# Patient Record
Sex: Female | Born: 1955 | Race: White | Hispanic: Yes | Marital: Single
Health system: Midwestern US, Community
[De-identification: ages and names within clinical notes are randomized; demographics above are authoritative.]

## PROBLEM LIST (undated history)

## (undated) DIAGNOSIS — R600 Localized edema: Secondary | ICD-10-CM

## (undated) DIAGNOSIS — J4530 Mild persistent asthma, uncomplicated: Principal | ICD-10-CM

## (undated) DIAGNOSIS — K219 Gastro-esophageal reflux disease without esophagitis: Secondary | ICD-10-CM

## (undated) DIAGNOSIS — F319 Bipolar disorder, unspecified: Secondary | ICD-10-CM

## (undated) DIAGNOSIS — F900 Attention-deficit hyperactivity disorder, predominantly inattentive type: Secondary | ICD-10-CM

## (undated) DIAGNOSIS — F5101 Primary insomnia: Principal | ICD-10-CM

---

## 2017-02-22 IMAGING — CT CT SINUS WITHOUT CONTRAST
3 series · 13 of 47 positions shown, 15 images · non-contrast
Comparison: There are no previous exams available for comparison.

CT SINUS WITHOUT CONTRAST, 02/22/2017 [DATE]: 
CLINICAL INDICATION:  Allergic rhinitis. Sinus infection. Headaches. 
A search for DICOM formatted images was conducted for prior CT imaging studies 
completed at a non-affiliated media free facility.
TECHNIQUE: The paranasal sinuses were scanned without contrast on a high 
resolution CT scanner using dose reduction techniques.  Routine MPR 
reconstructions were performed.

[Series 4: sinus st 0.75 h30s · axial · 0.37mm/px · z∈[-164,-84]mm · 7 of 138 slices shown, 9 images]
[im 15/138  brain]
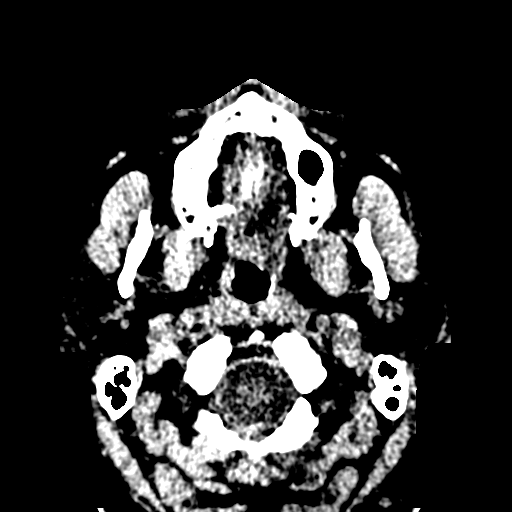
[im 15/138  bone]
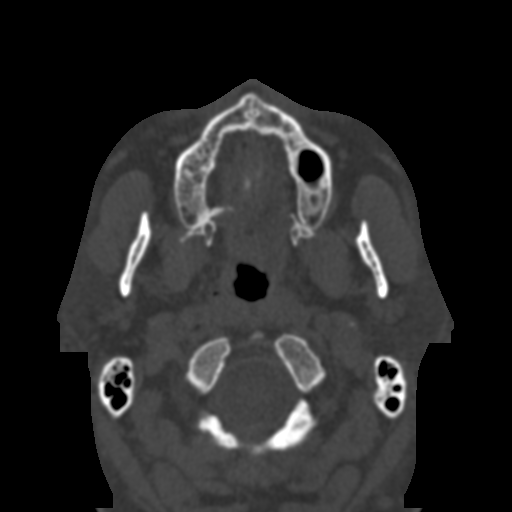
[im 34/138  bone]
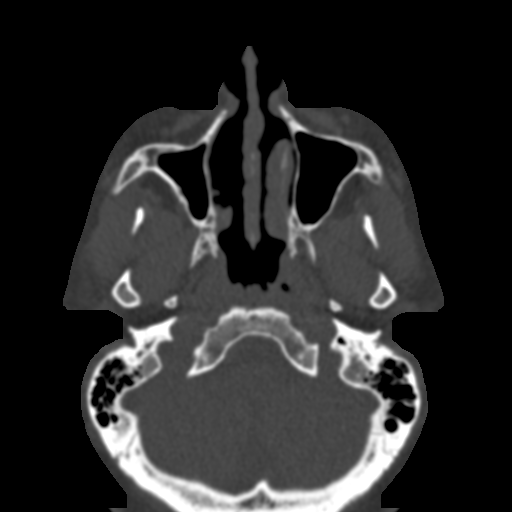
[im 52/138  bone]
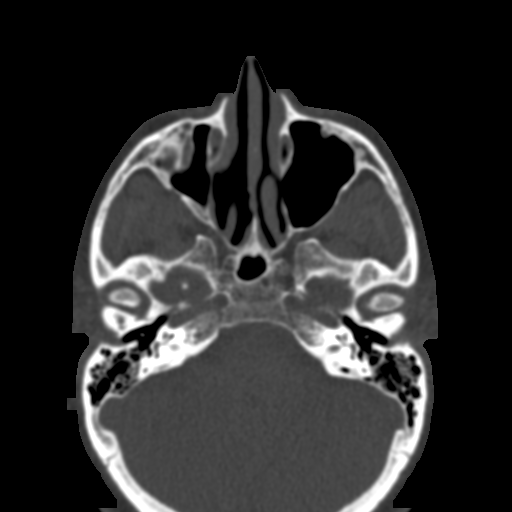
[im 71/138  bone]
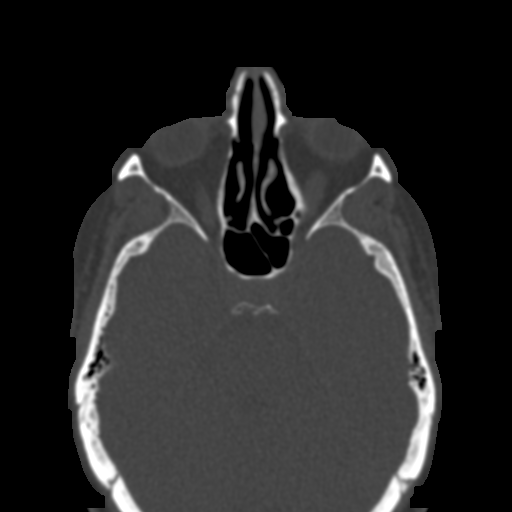
[im 90/138  brain]
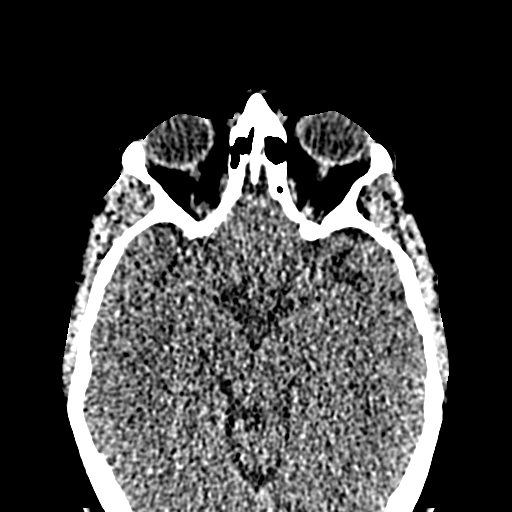
[im 90/138  bone]
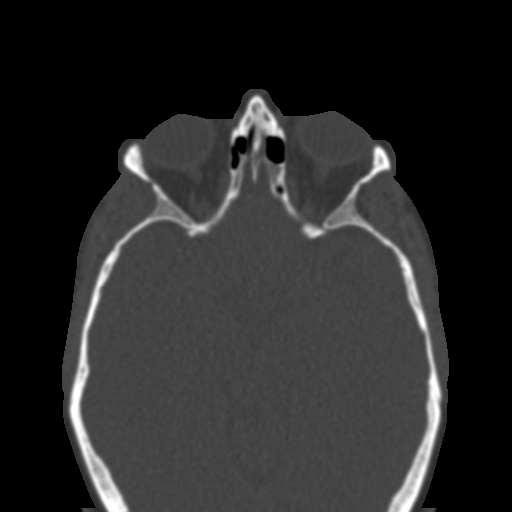
[im 109/138  bone]
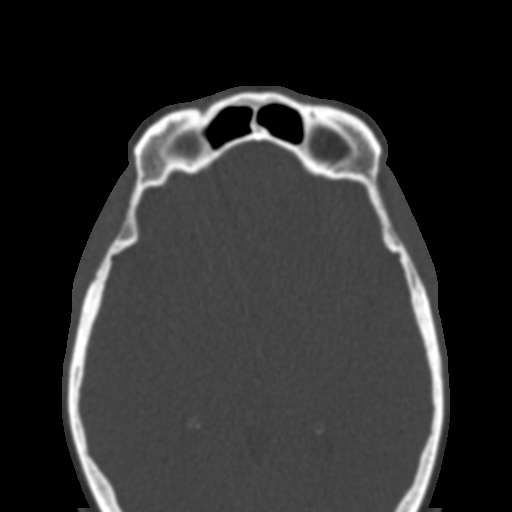
[im 128/138  bone]
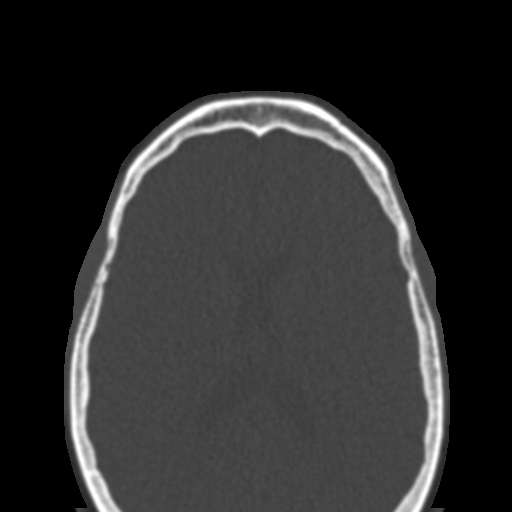

[Series 5: coronal · coronal · 0.20mm/px · 3 of 176 slices shown]
[im 59/176  bone]
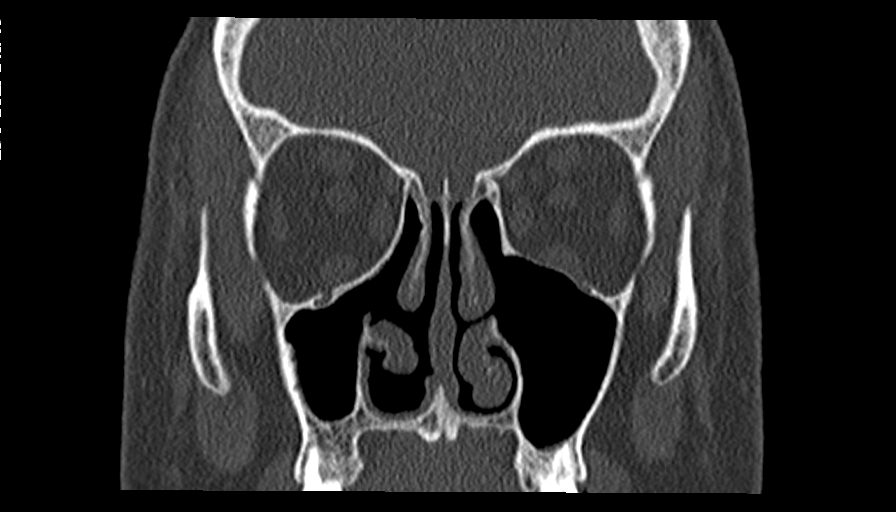
[im 78/176  bone]
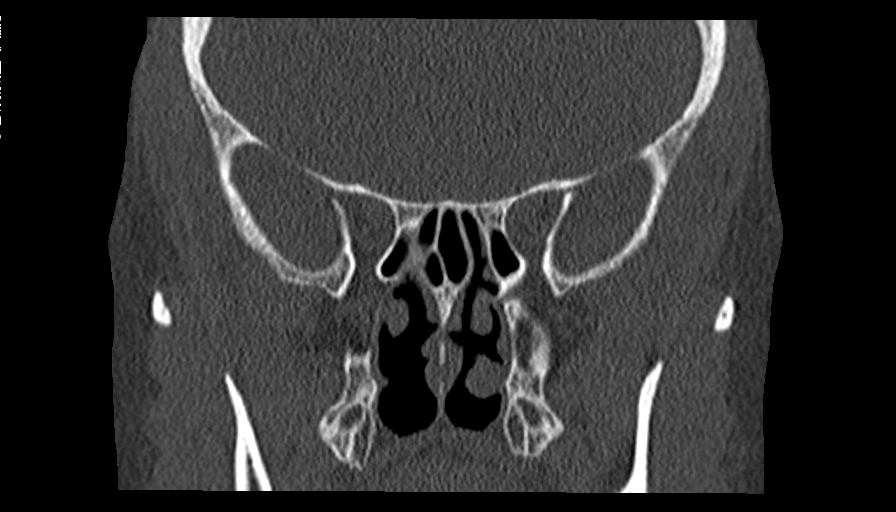
[im 98/176  bone]
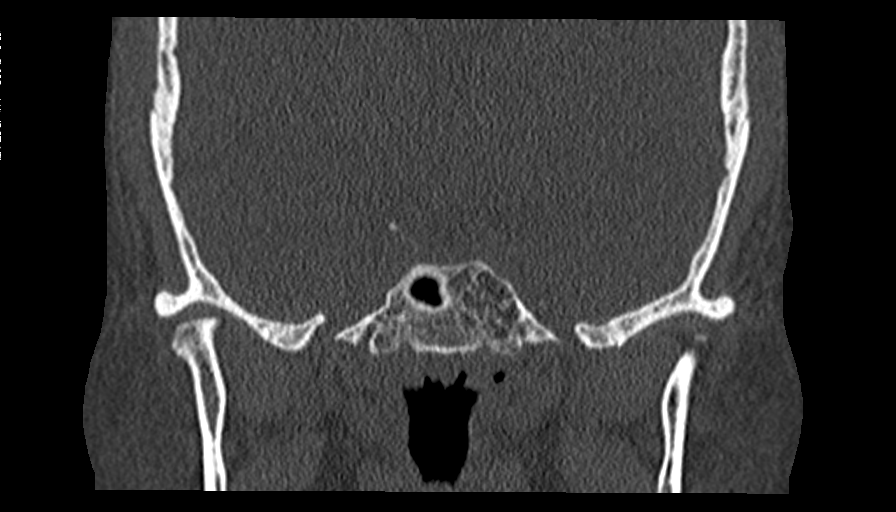

[Series 6: sag · sagittal · 0.21mm/px · 3 of 156 slices shown]
[im 52/156  bone]
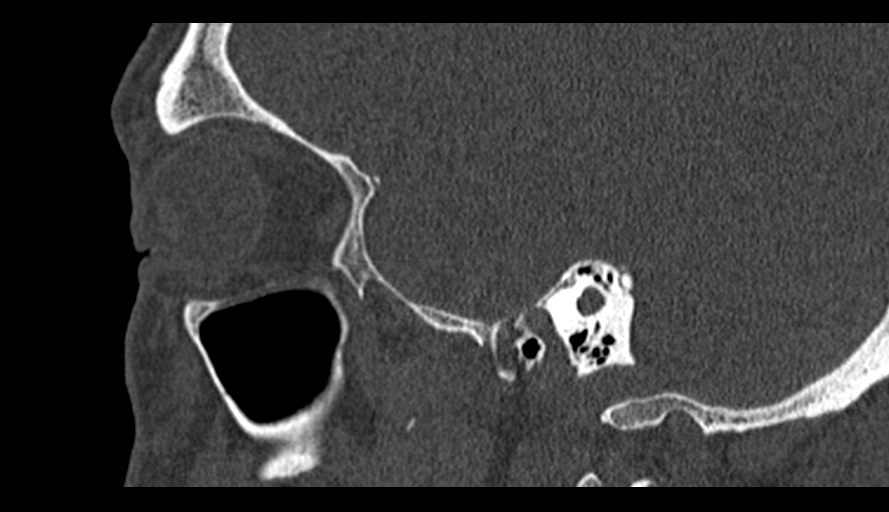
[im 78/156  bone]
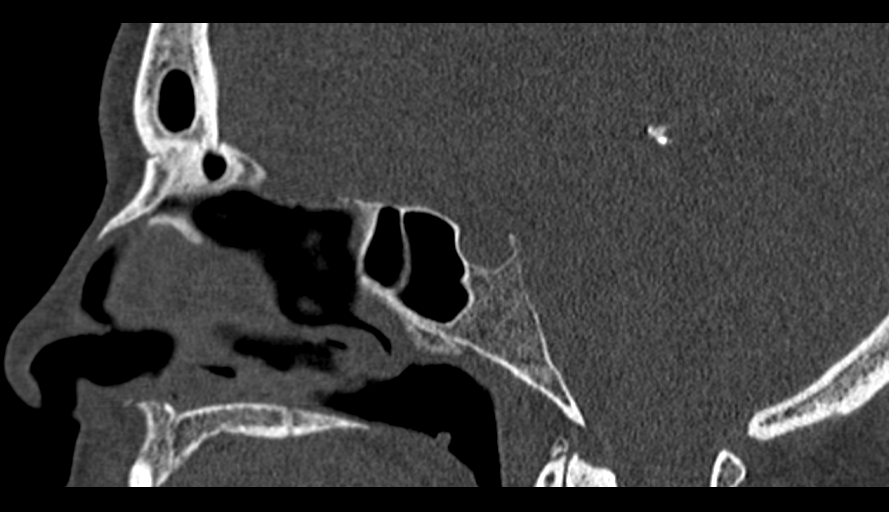
[im 104/156  bone]
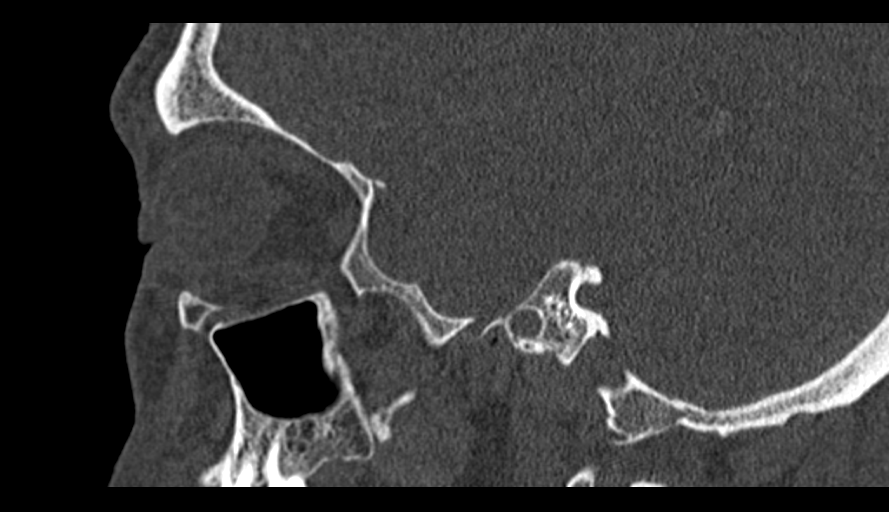

[13 of 47 positions shown; findings below may reference images not displayed]

FINDINGS: Sinuses: Prior sinus surgery bilaterally. Wide openings are present between 
the 
nasal cavity as well as superior medial aspect of the maxillary sinuses. 
Ostiomeatal complexes are absent. Widely patent. Prior partial surgical 
removal 
of portions of the anterior as well as posterior ethmoid sinuses. Partial 
turbinate removal. Operative sites appear unremarkable. Minimal mucosal 
thickening inferiorly within the left maxillary sinus. Sinuses are otherwise 
clear. Right maxillary sinus somewhat smaller as compared with the left. 
Sphenoid ethmoid recesses: Clear 
Frontal recess: Clear. 
The nasal septum is midline in position. Remaining turbinates equal and 
symmetric. Orbital walls are intact. No evidence of an air-fluid level. 
TMJ: Normal 
Mastoid air cells: Clear
IMPRESSION: 1. Evidence of prior extensive sinus surgery bilaterally. Partial surgical 
removal of the superior medial walls of the maxillary sinuses bilaterally, 
anterior as well as posterior ethmoid sinuses, as well as turbinates. 
Operative 
sites appear unremarkable and widely patent. 
2. Minimal mucosal thickening inferiorly left maxillary sinus. Sinuses 
otherwise 
clear. 
3. Frontal recesses as well as sphenoethmoid recesses are clear. 
RADIATION DOSE REDUCTION: All CT scans are performed using radiation dose 
reduction techniques, when applicable.  Technical factors are evaluated and 
adjusted to ensure appropriate moderation of exposure.  Automated dose 
management technology is applied to adjust the radiation doses to minimize 
exposure while achieving diagnostic quality images.

## 2017-02-22 IMAGING — DX ABDOMEN 1 VIEW
1 series · 2 of 2 positions shown · non-contrast
Comparison: None

ABDOMEN 1 VIEW, 02/22/2017 [DATE]: 
CLINICAL INDICATION: 61-year-old female with abdominal bloating for several 
years, pain, history of C-section and appendectomy, smoker

[Series 1: AP · U · 0.14mm/px · 2 of 2 slices shown]
[im 1/2]
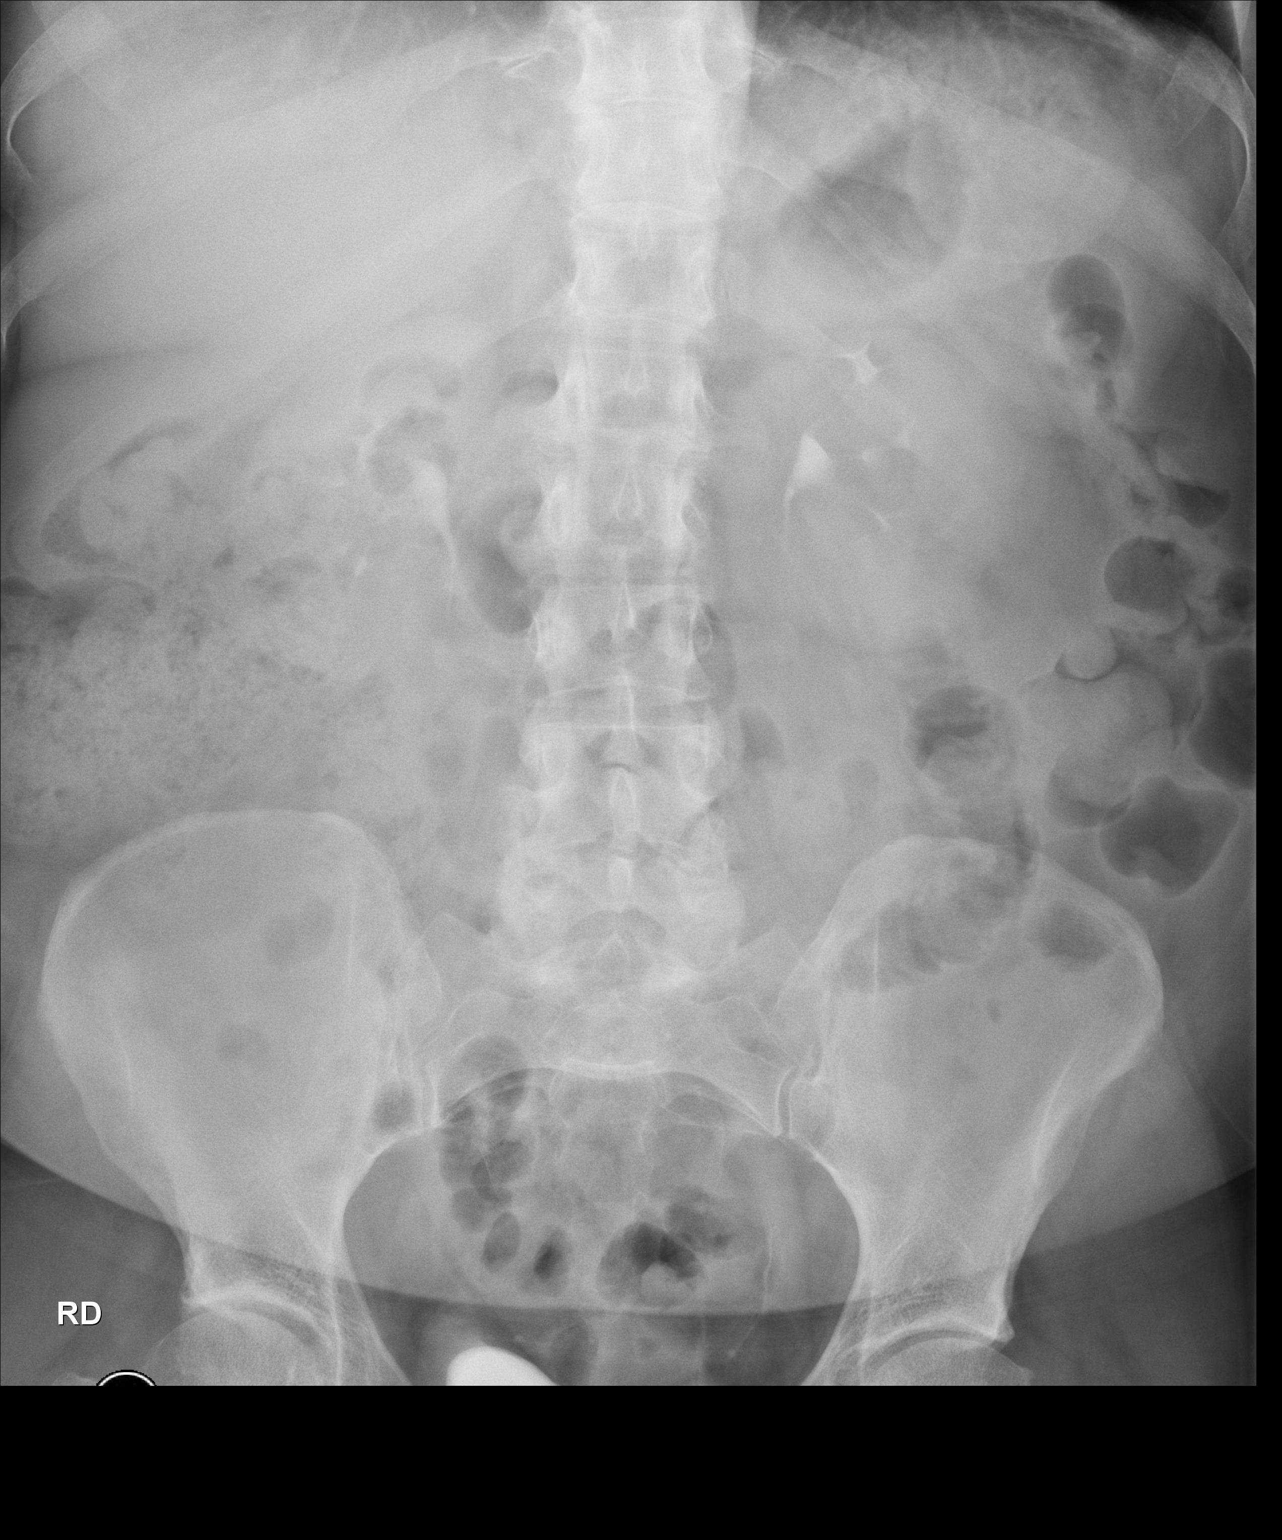
[im 2/2]
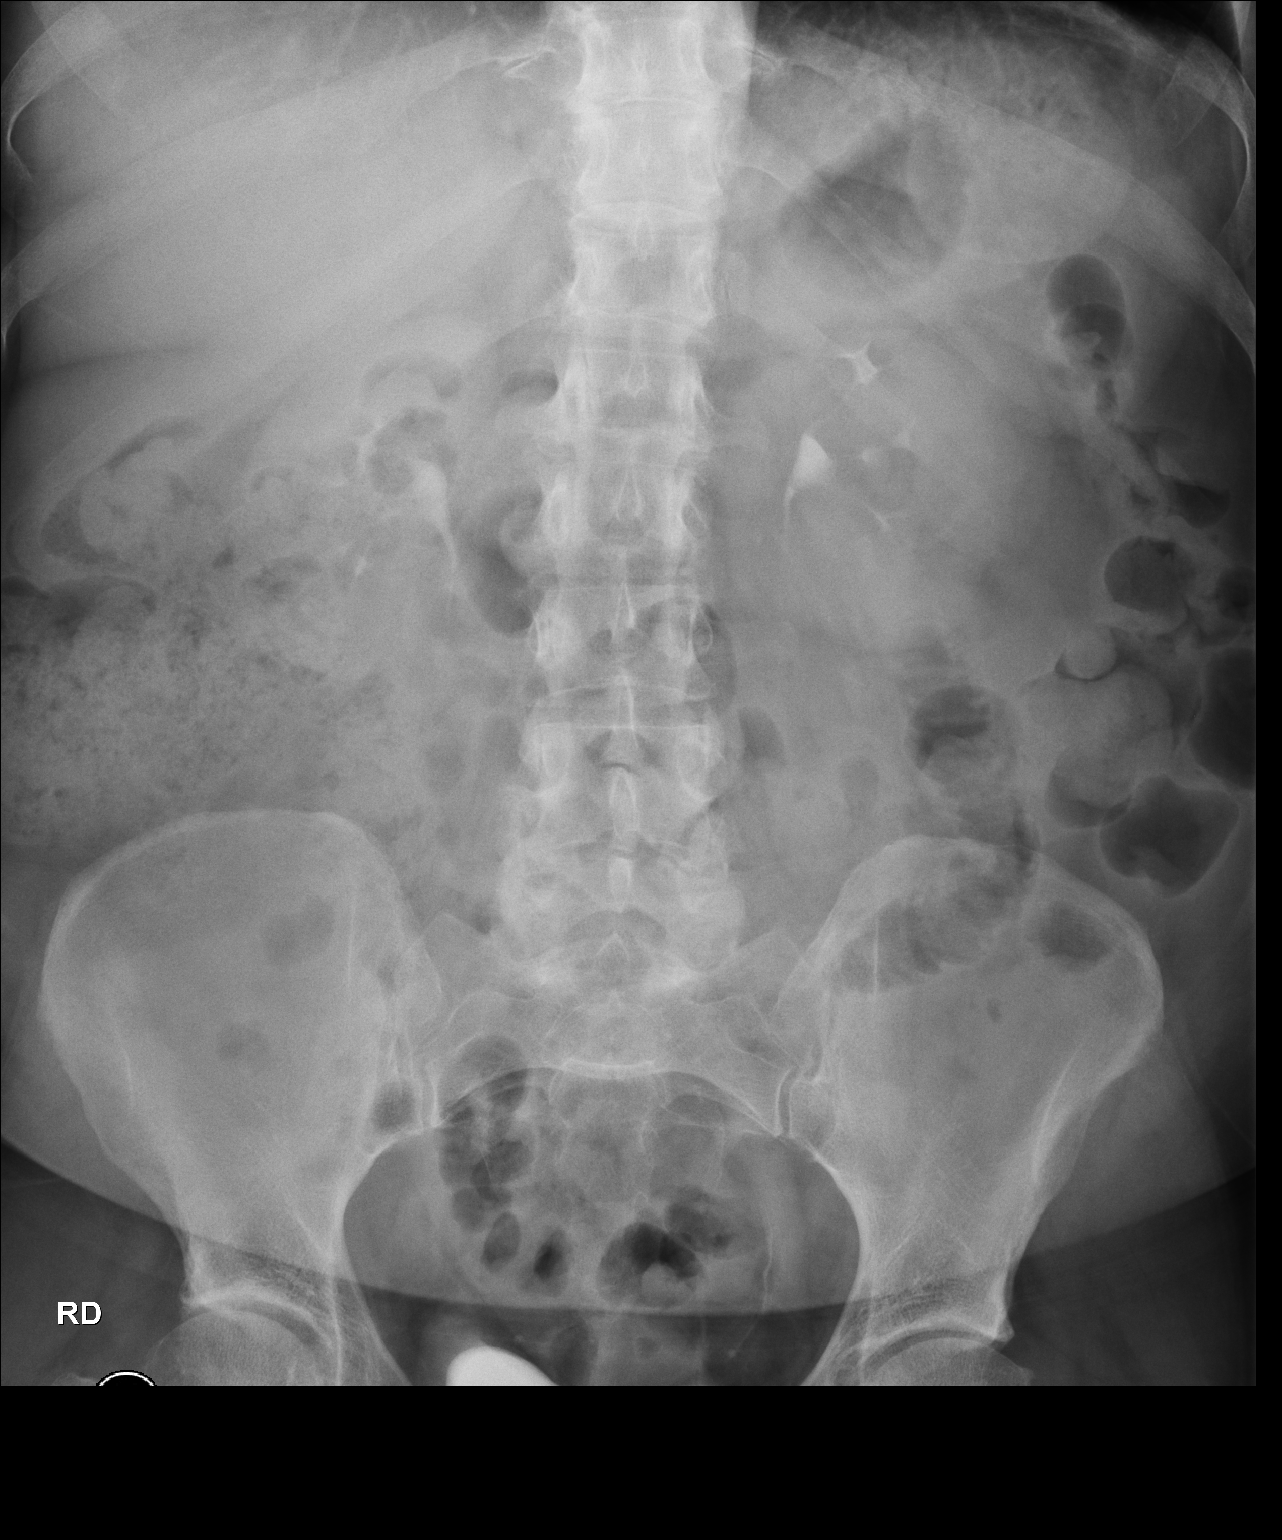

[2 of 2 positions shown; findings below may reference images not displayed]

FINDINGS: KUB was performed following CT neck performed with IV contrast. There is 
excretion of contrast from both kidneys with nondilated collecting systems and 
filling of the urinary bladder. 
Moderate fecal retention in the right and left abdomen. 
No definite suspicious calcification. Cannot assess for free air on this 
single 
view exam. 
Bilateral hip osteoarthritis. 
Mild lumbar degenerative spondylitic change.
IMPRESSION: Moderate fecal retention. 
No suspicious calcification. 
Normal excretion of contrast from the kidneys with filling of the urinary 
bladder. 
Bilateral hip osteoarthritis.

## 2017-02-22 IMAGING — CT CT NECK WITH CONTRAST
4 of 5 series · 14 of 33 positions shown, 16 images · IV contrast (isovue)
Comparison: There are no previous exams available for comparison.

CT NECK WITH CONTRAST, 02/22/2017 [DATE]: 
CLINICAL INDICATION:  Allergic rhinitis. Lymphadenopathy. Sinus infection. 
Headaches. Lymph node on right side of neck under jaw bone. Getting larger for 
3 
years. On antibiotics 2 weeks ago. 
A search for DICOM formatted images was conducted for prior CT imaging studies 
completed at a non-affiliated media free facility.
TECHNIQUE: The neck was scanned from the level of the maxillary sinuses 
through 
the AP Window with 100 cc's of Isovue 300 injected intravenously on a 
high-resolution CT scanner using dose reduction techniques.  Routine MPR 
reconstructions were performed. Right neck "lump" marked with metallic 
markers.

[Series 7: neck w 2.0 b31s · axial · 0.44mm/px · z∈[-308,-164]mm · 4 of 121 slices shown, 5 images]
[im 25/121  soft-tissue]
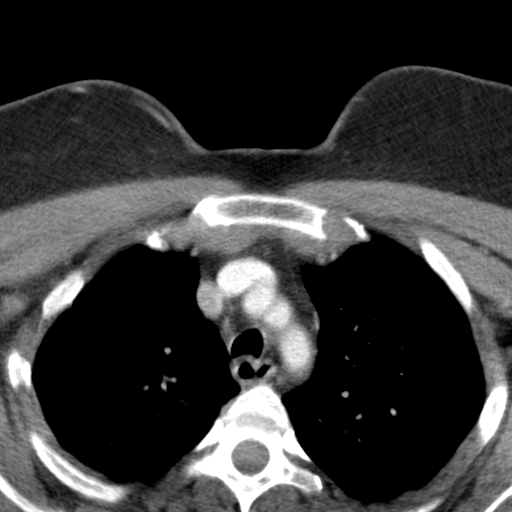
[im 25/121  bone]
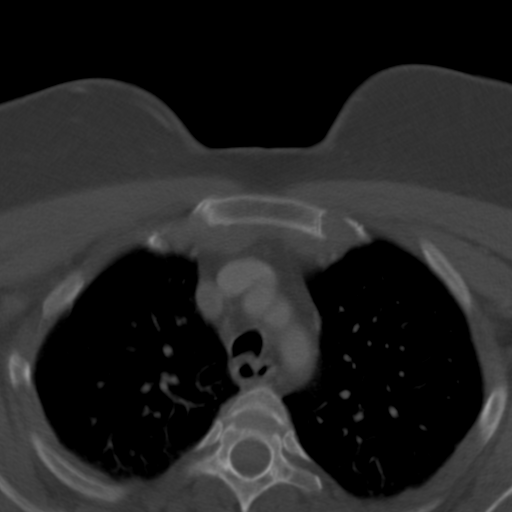
[im 49/121  bone]
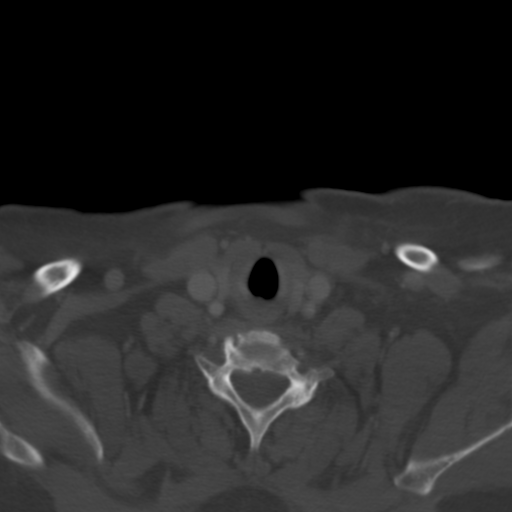
[im 73/121  bone]
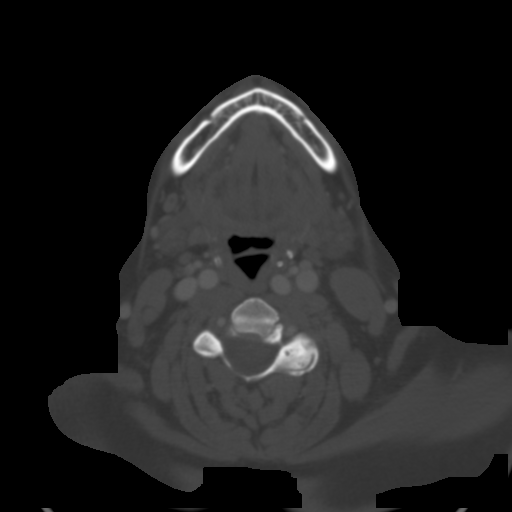
[im 97/121  bone]
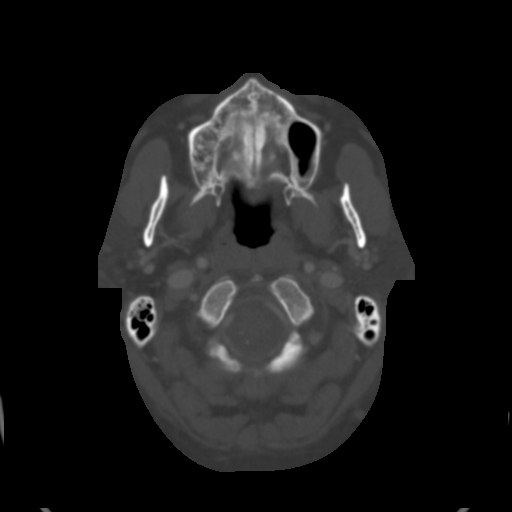

[Series 8: coronal · coronal · 0.37mm/px · 3 of 103 slices shown]
[im 21/103  bone]
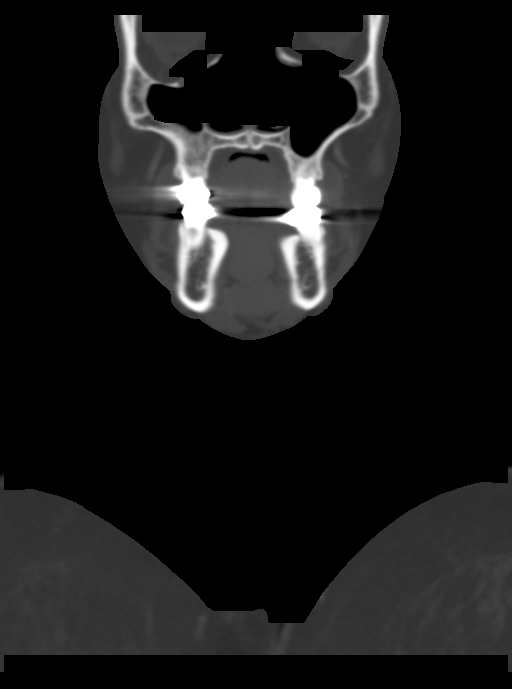
[im 41/103  bone]
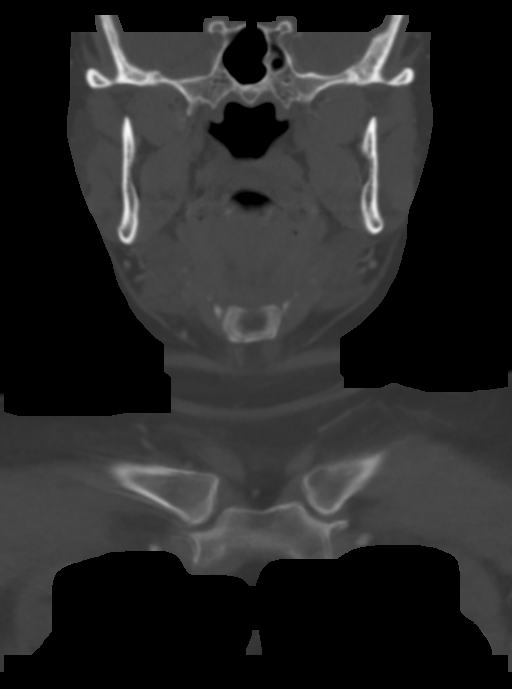
[im 62/103  bone]
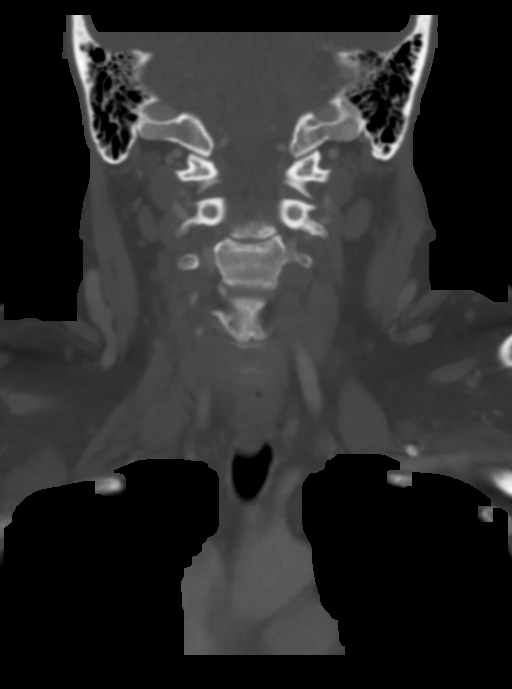

[Series 9: sag · sagittal · 0.43mm/px · 5 of 76 slices shown, 6 images]
[im 26/76  bone]
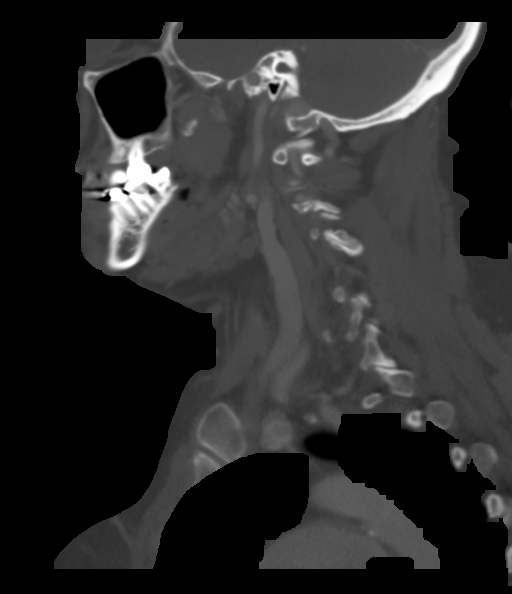
[im 32/76  bone]
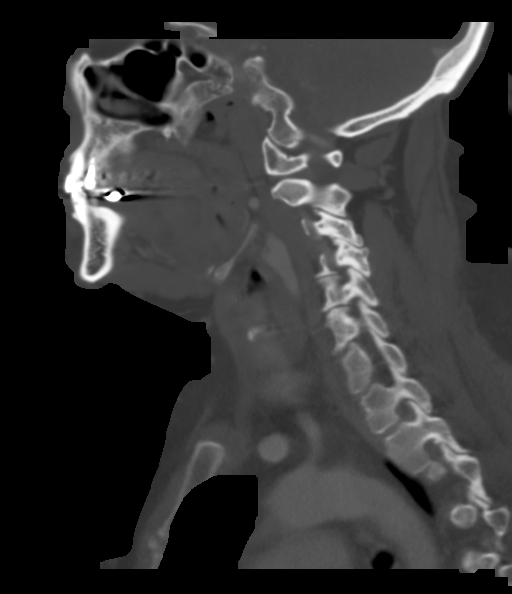
[im 38/76  soft-tissue]
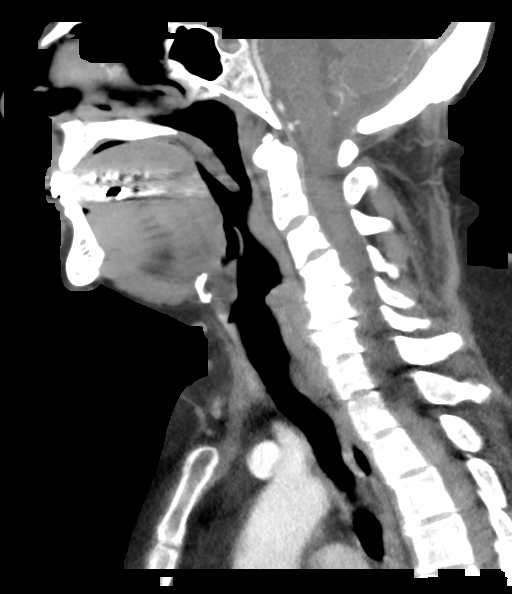
[im 38/76  bone]
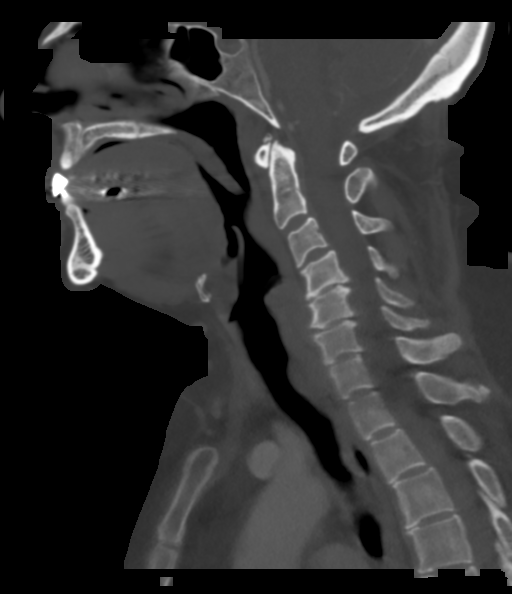
[im 44/76  bone]
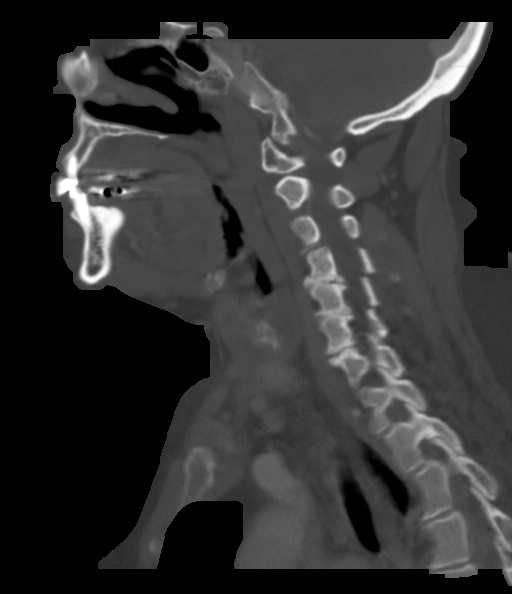
[im 51/76  bone]
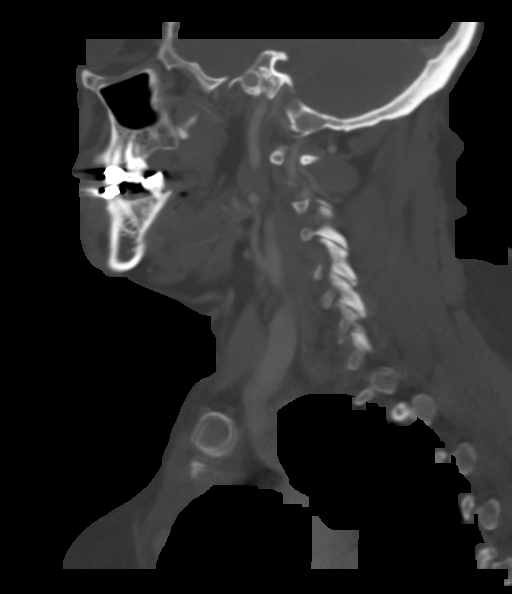

[Series 11: neck w 2.0 (person_name)31(person_name) · axial · 0.44mm/px · z∈[-308,-260]mm · 2 of 121 slices shown]
[im 25/121  bone]
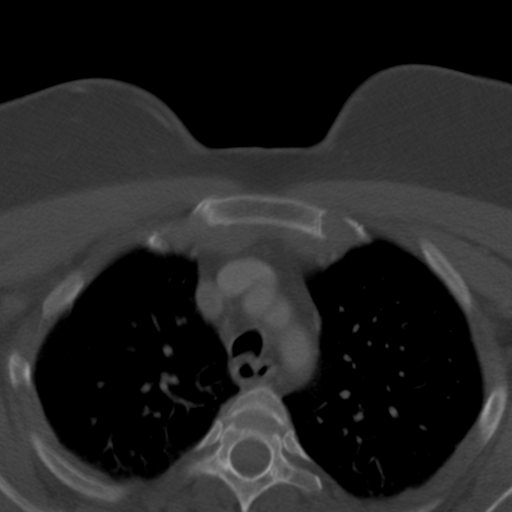
[im 49/121  bone]
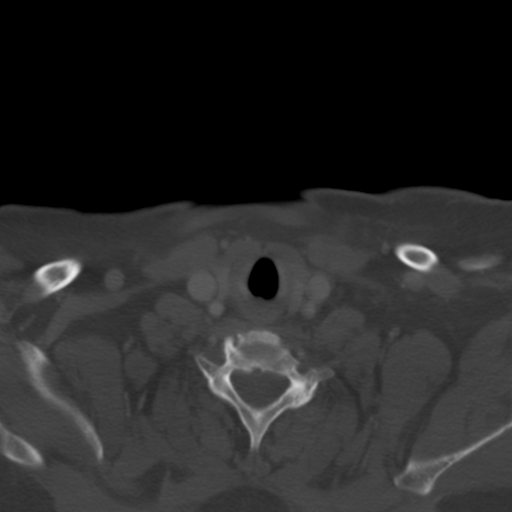

[14 of 33 positions shown; findings below may reference images not displayed]

FINDINGS: Limited exam of the intracranial structures demonstrates no 
abnormalities. No mastoid air cell disease. Orbits appear equal and symmetric. 
Sinuses as per current dedicated CT sinus report. 
Suprahyoid spaces: The masseteric, parotid, carotid, anterior parapharyngeal, 
retropharyngeal, and perivertebral spaces appear intact. Subcentimeter lymph 
nodes bilaterally. No lymphadenopathy. Mucosal surfaces unremarkable. Lesser 
salivary glands intact. 
Infrahyoid spaces: The carotid, retropharyngeal, prevertebral, and visceral 
spaces are intact. Subcentimeter lymph nodes bilaterally. No lymphadenopathy. 
Mucosal surfaces appear unremarkable. 
Bone alignment is anatomic. Mild to moderate degenerative disc disease C- 
spine. 
No lytic or blastic process. Limited exam thoracic inlet demonstrates no 
abnormalities.
IMPRESSION: 1. Negative CT soft tissue neck. Specifically, no abnormal soft tissue 
attenuation mass or lymphadenopathy. Patient states history of right neck 
"lump" 
under jaw bone. No underlying CT abnormalities. 
RADIATION DOSE REDUCTION: All CT scans are performed using radiation dose 
reduction techniques, when applicable.  Technical factors are evaluated and 
adjusted to ensure appropriate moderation of exposure.  Automated dose 
management technology is applied to adjust the radiation doses to minimize 
exposure while achieving diagnostic quality images.

## 2020-01-21 IMAGING — CT CT NECK WITH CONTRAST
4 of 5 series · 15 of 33 positions shown, 18 images · IV contrast (isovue)
Comparison: There are no previous exams available for comparison.

CT NECK WITH CONTRAST, 01/21/2020 [DATE]: 
CLINICAL INDICATION:  Neck lump with painful swallowing 
A search for DICOM formatted images was conducted for prior CT imaging studies 
completed at a non-affiliated media free facility.
TECHNIQUE: The neck was scanned from the level of the maxillary sinuses through 
the AP Window with 100 cc's of Isovue 300 injected intravenously on a 
high-resolution CT scanner using dose reduction techniques.  Routine MPR 
reconstructions were performed.

[Series 2: neck w 2.0 b31s · axial · 0.43mm/px · z∈[+786,+952]mm · 5 of 125 slices shown, 7 images]
[im 21/125  soft-tissue]
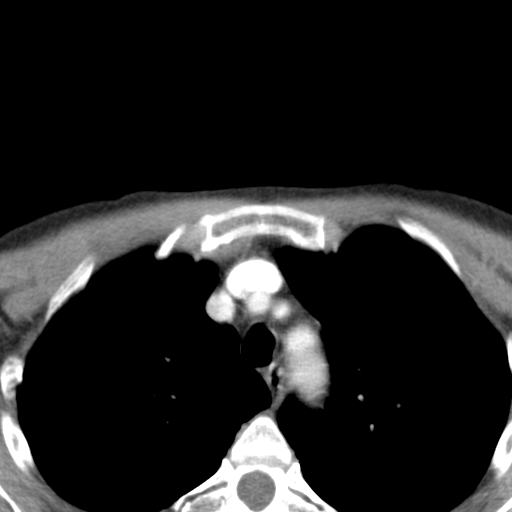
[im 21/125  bone]
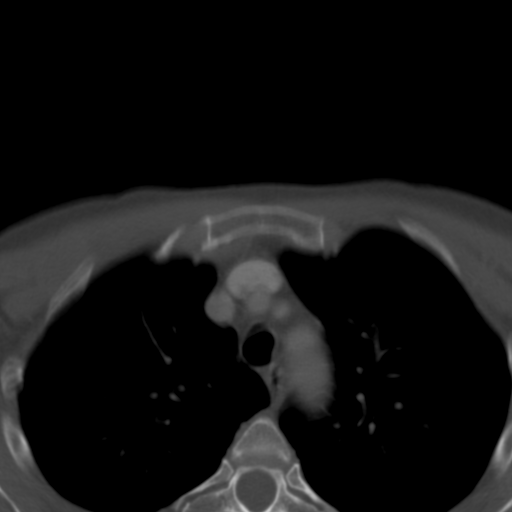
[im 42/125  bone]
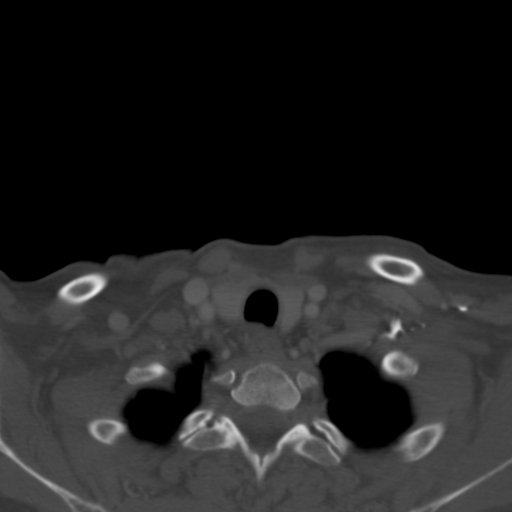
[im 63/125  bone]
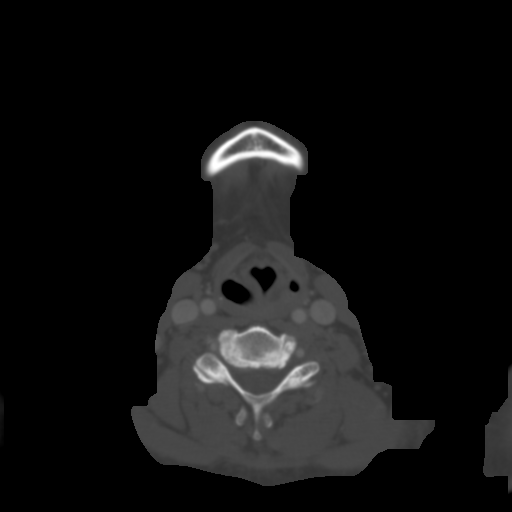
[im 83/125  bone]
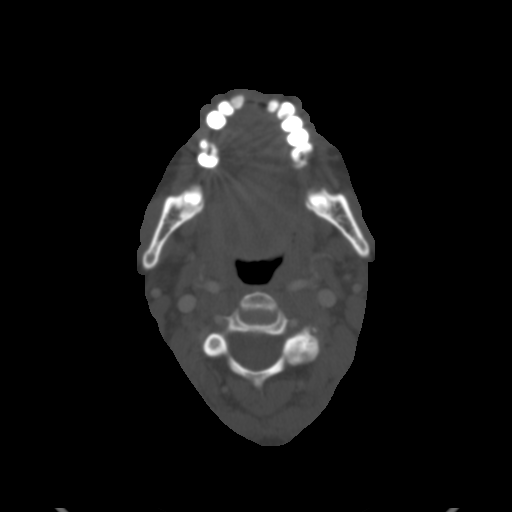
[im 104/125  soft-tissue]
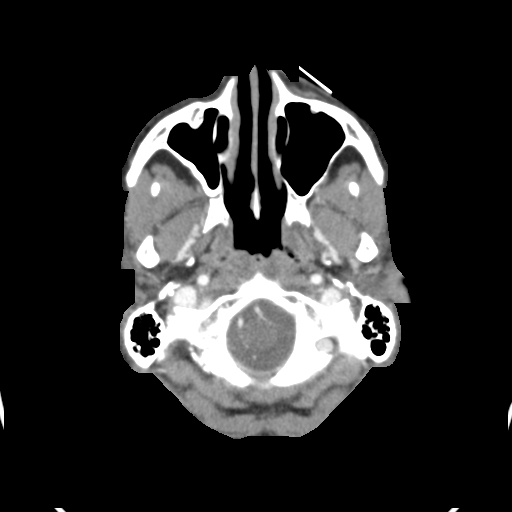
[im 104/125  bone]
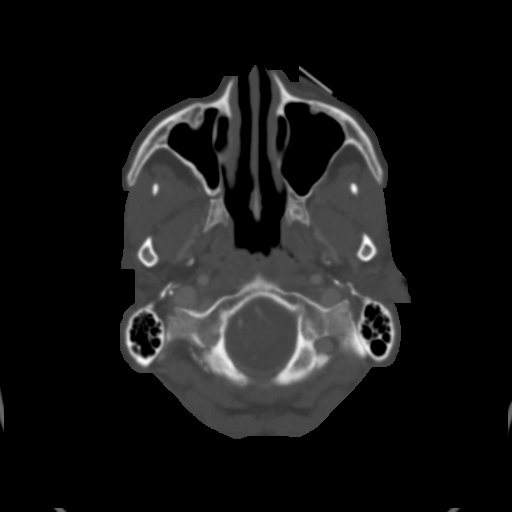

[Series 3: coronal · coronal · 0.48mm/px · 3 of 104 slices shown]
[im 21/104  bone]
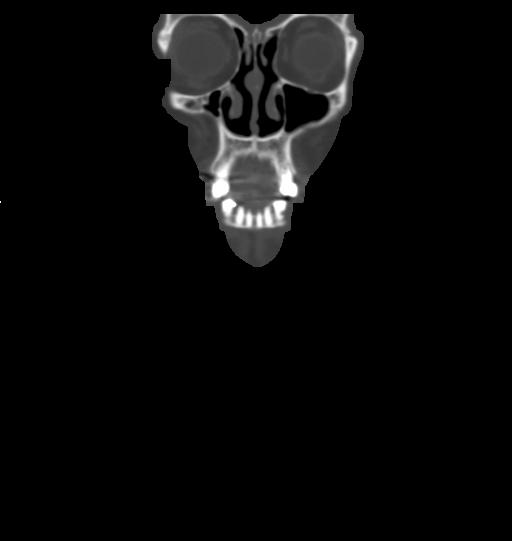
[im 42/104  bone]
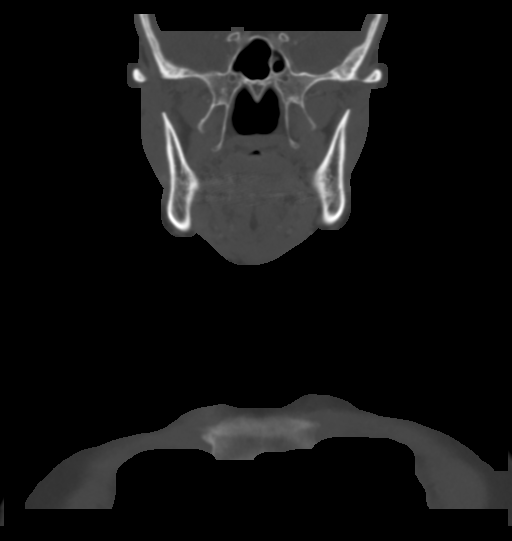
[im 62/104  bone]
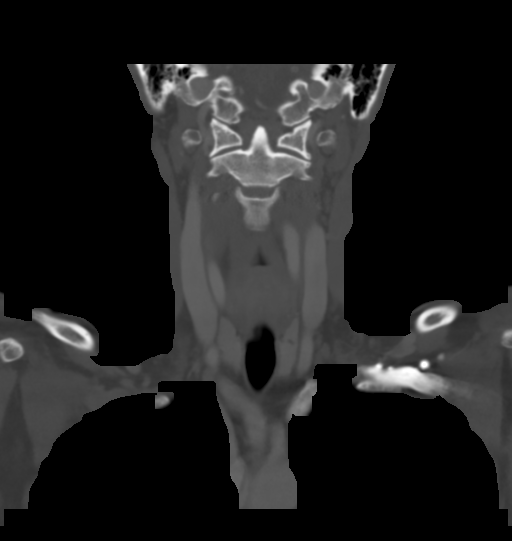

[Series 4: sag · sagittal · 0.48mm/px · 5 of 91 slices shown, 6 images]
[im 31/91  bone]
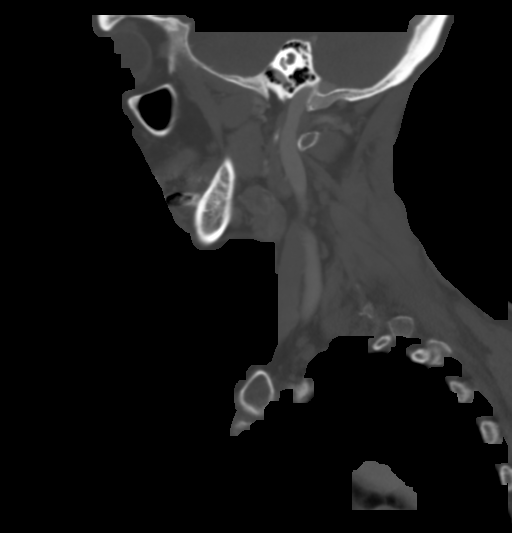
[im 38/91  bone]
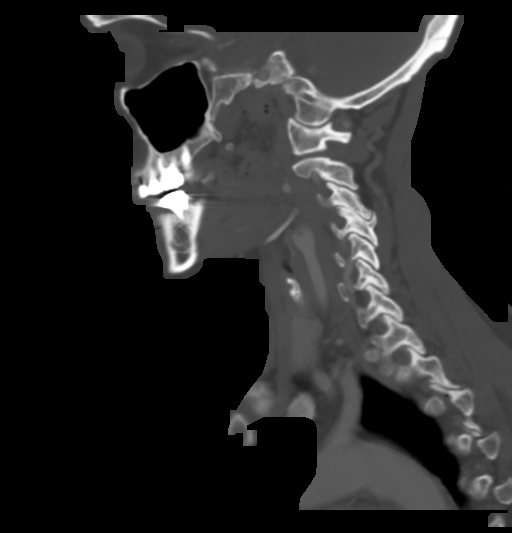
[im 46/91  soft-tissue]
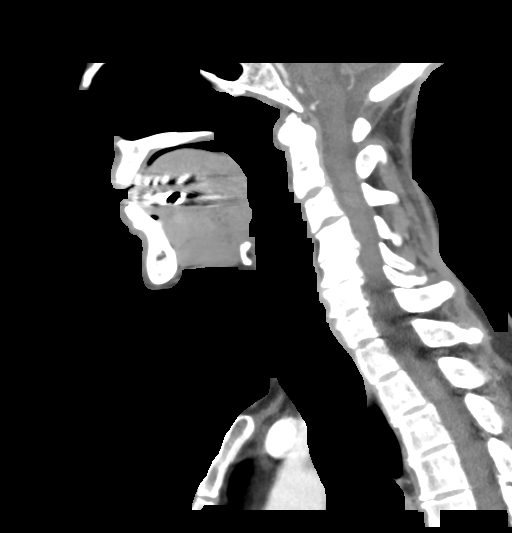
[im 46/91  bone]
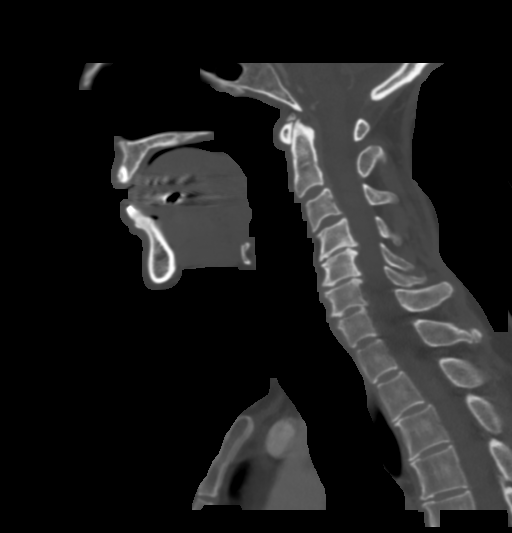
[im 53/91  bone]
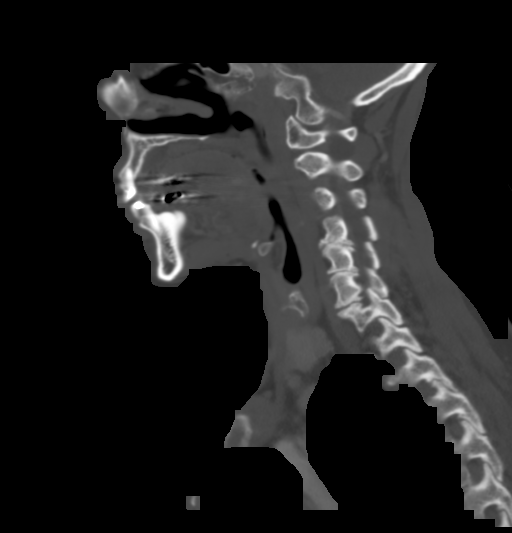
[im 61/91  bone]
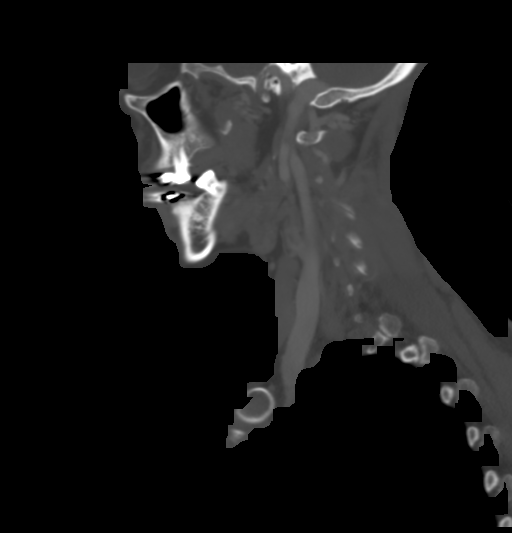

[Series 5: neck w 2.0 (person_name)31(person_name) · axial · 0.43mm/px · z∈[+794,+844]mm · 2 of 125 slices shown]
[im 25/125  bone]
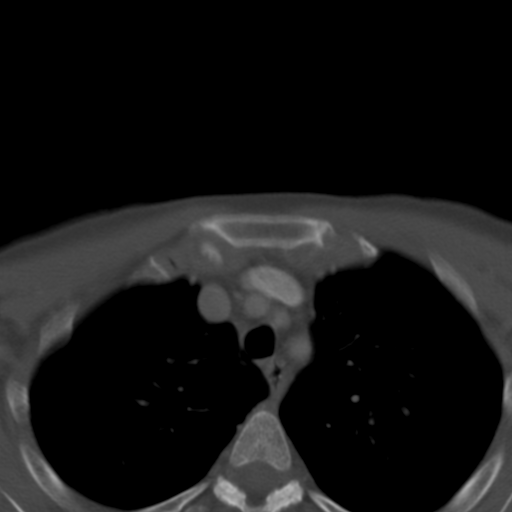
[im 50/125  bone]
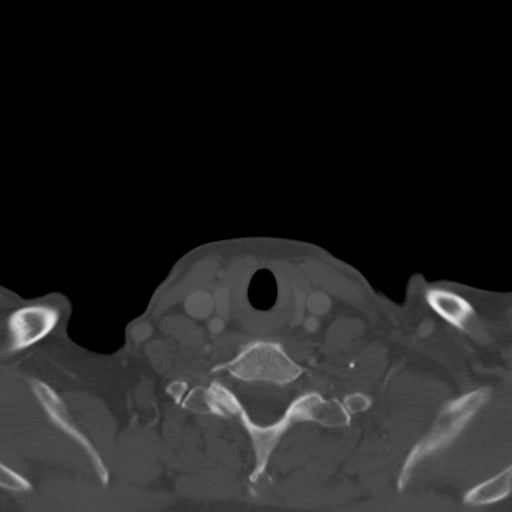

[15 of 33 positions shown; findings below may reference images not displayed]

FINDINGS: Nasopharyngeal contours are preserved. There is mild asymmetric 
thickening of the right tonsillar pillar extending to the right glossotonsillar 
sulcus. Direct visualization mucosal surface recommended to exclude squamous 
cell cancer. Otherwise the supraglottic and glottic contours appear symmetric. 
Subglottic trachea is open. There is no cervical lymphadenopathy. The parotid 
and submandibular glands are unremarkable. Thyroid lobes are not enlarged. There 
is a 3 mm hypodense left thyroid nodule, likely incidental adenoma.
IMPRESSION: Mild asymmetric prominence of the right tonsillar pillar and glossotonsillar 
sulcus is likely due to chronic inflammation. However, direct visualization 
recommended to evaluate for potential early squamous cell cancer. 
No discrete mass. No adenopathy. 
3.5 mm right upper lobe nodule. Chest CT recommended for further evaluation. 
RADIATION DOSE REDUCTION: All CT scans are performed using radiation dose 
reduction techniques, when applicable.  Technical factors are evaluated and 
adjusted to ensure appropriate moderation of exposure.  Automated dose 
management technology is applied to adjust the radiation doses to minimize 
exposure while achieving diagnostic quality images.

## 2020-01-21 IMAGING — CT CT CERVICAL SPINE WITHOUT CONTRAST
3 of 4 series · 15 of 33 positions shown, 17 images · non-contrast
Comparison: There are no previous exams available for comparison.

CT CERVICAL SPINE WITHOUT CONTRAST, 01/21/2020 [DATE]: 
CLINICAL INDICATION: Neck pain 
A search for DICOM formatted images was conducted for prior CT imaging studies 
completed at a non-affiliated media free facility.
TECHNIQUE: The cervical spine was scanned from the skull base through T1 
vertebra without contrast on a high-resolution CT scanner using dose reduction 
techniques. Routing MPR reconstructions were performed.

[Series 3: c_spine 2.0 b31s · axial · 0.33mm/px · z∈[+800,+984]mm · 7 of 110 slices shown, 9 images]
[im 9/110  soft-tissue]
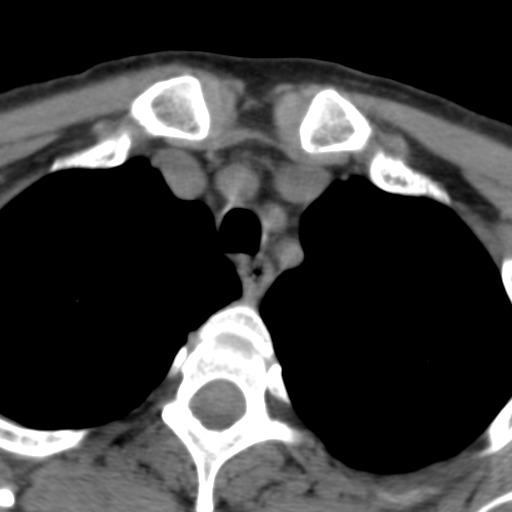
[im 9/110  bone]
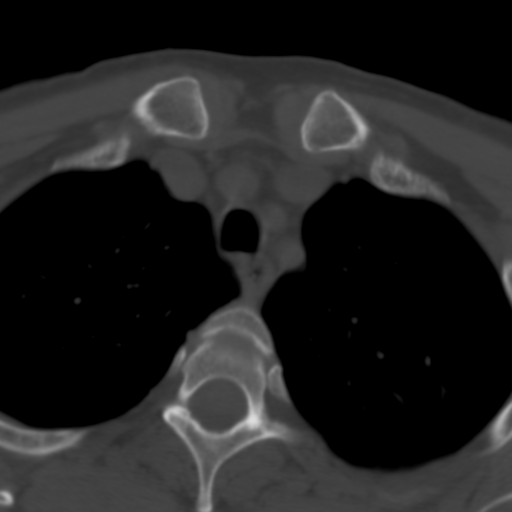
[im 26/110  bone]
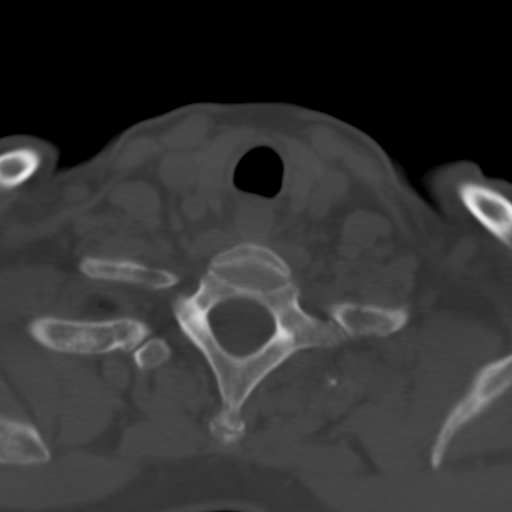
[im 42/110  bone]
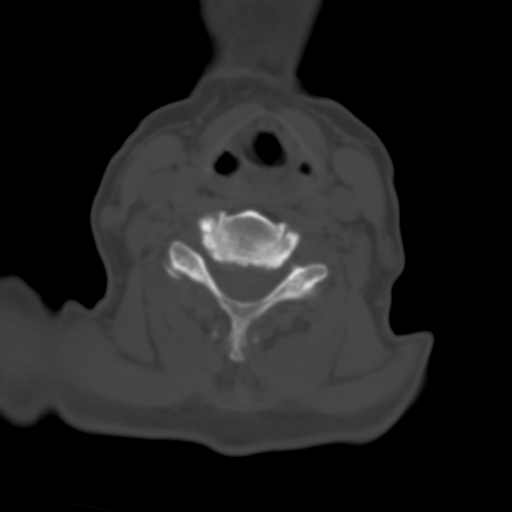
[im 59/110  bone]
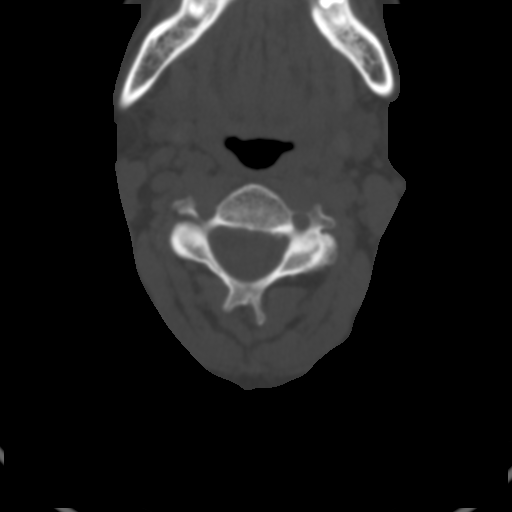
[im 68/110  soft-tissue]
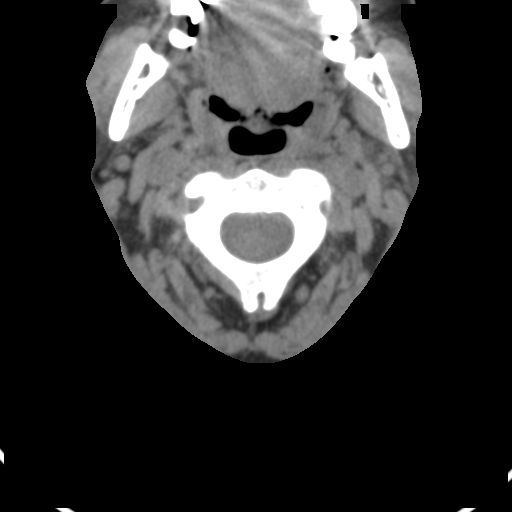
[im 68/110  bone]
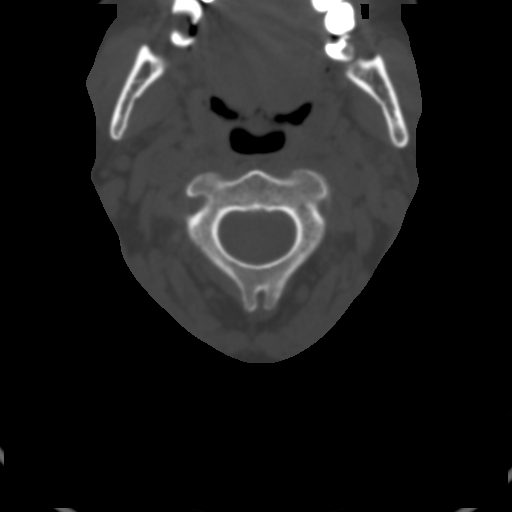
[im 84/110  bone]
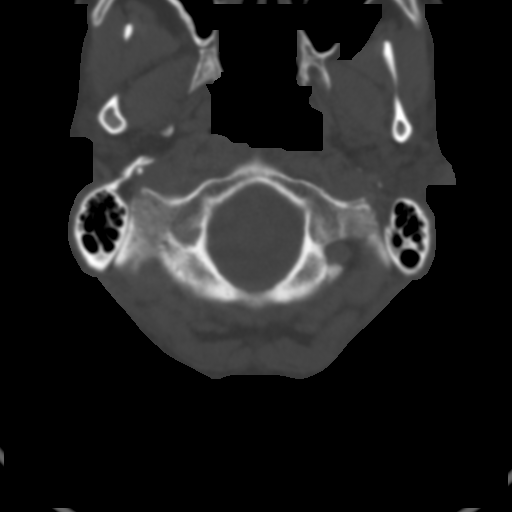
[im 101/110  bone]
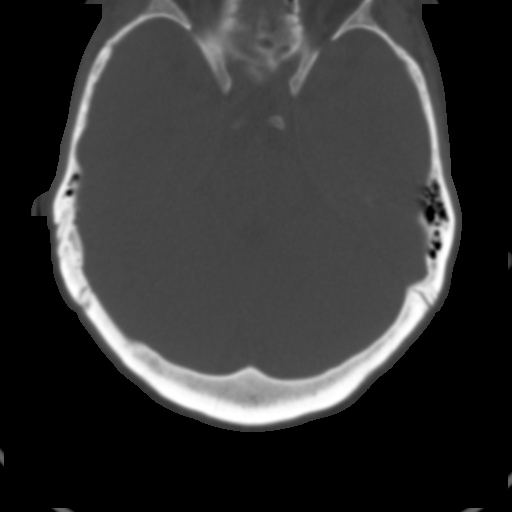

[Series 5: c_spine coronal · coronal · 0.38mm/px · 3 of 68 slices shown]
[im 14/68  bone]
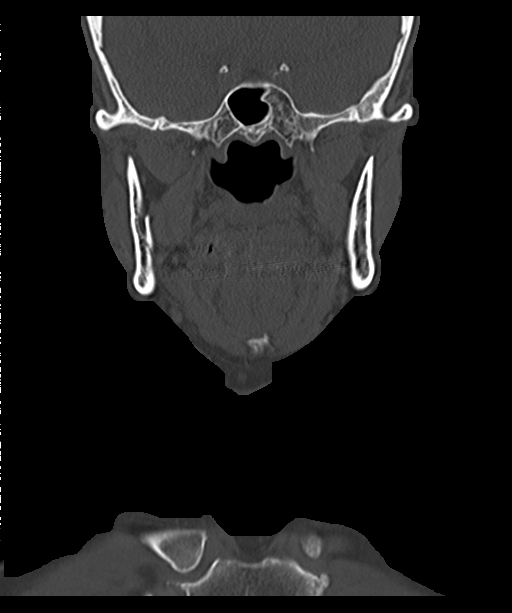
[im 27/68  bone]
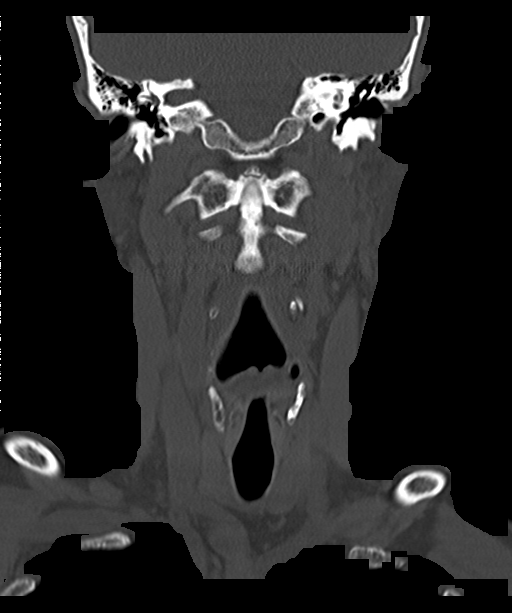
[im 41/68  bone]
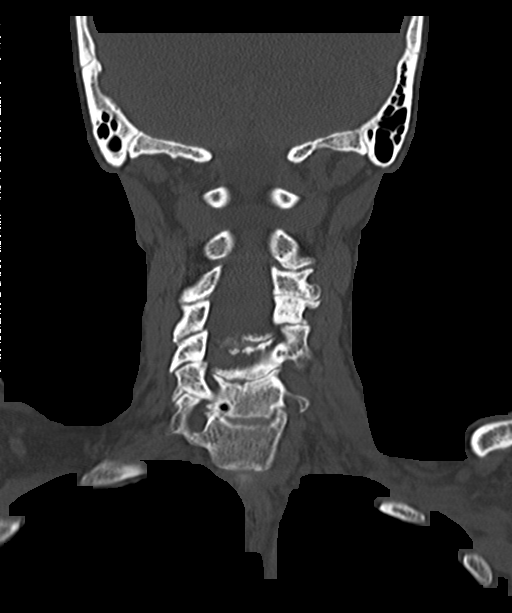

[Series 7: sag · sagittal · 0.39mm/px · 5 of 73 slices shown]
[im 13/73  bone]
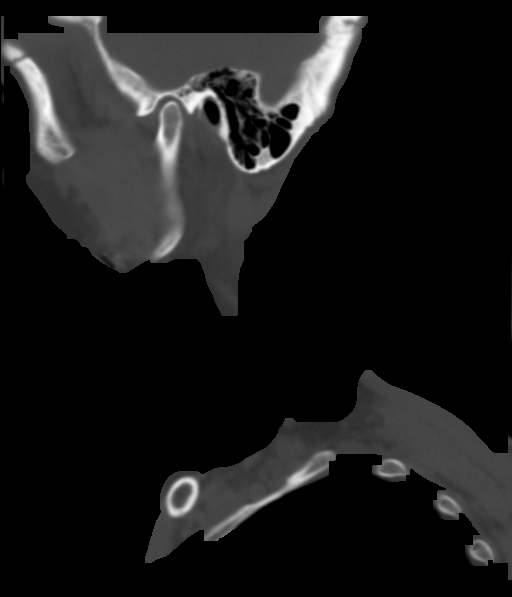
[im 25/73  bone]
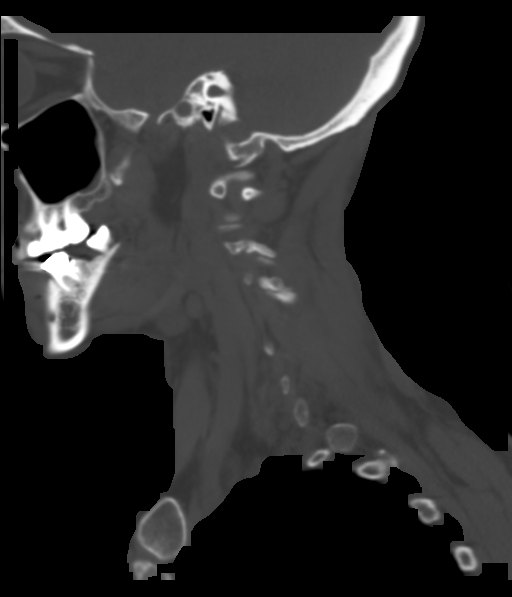
[im 37/73  bone]
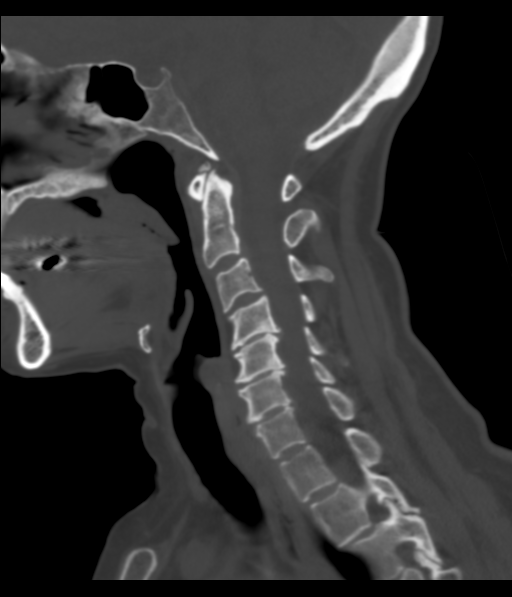
[im 49/73  bone]
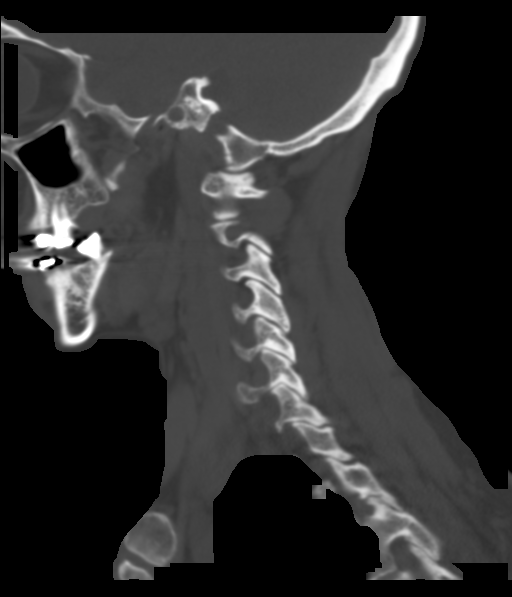
[im 61/73  bone]
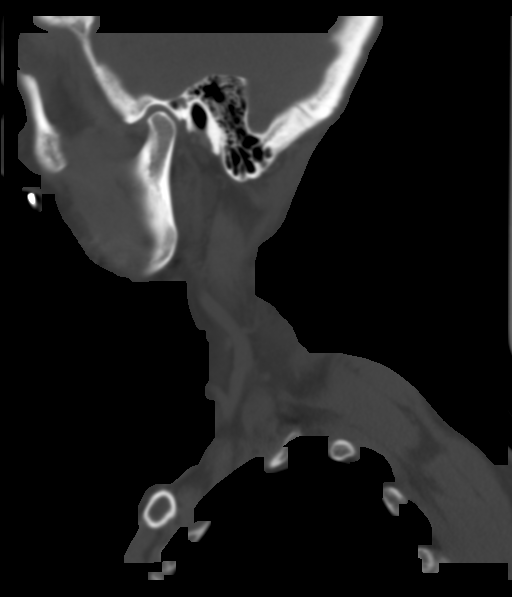

[15 of 33 positions shown; findings below may reference images not displayed]

FINDINGS: Cervical vertebral heights are intact. There is spondylosis, with 
marked disc narrowing from C4 through C7. Lordosis is straightened. There is 2 
mm anterolisthesis at C3-4. 
The dens is intact. The craniocervical junction appears open. Lordosis is 
straightened. 
There is disc-osteophyte with slight effacement of the ventral cord at C4-5. 
Disc-osteophyte touches the cord at C5-6. There is mild canal stenosis at both 
levels. At C6-7 soft tissue detail is limited, but there appears to be mild 
canal stenosis. 
At C2-3 and C3-4 there is borderline canal stenosis. There is moderate to marked 
left C3-4 foraminal stenosis, marked right, moderate left C4-5, moderate to 
marked right C5-6 and moderate right C6-7 foraminal stenosis. Other foramina are 
open. 
On axial image 90 there is a 3.5 mm nodule at the right lung apex. Dedicated 
chest CT recommended for further evaluation.
IMPRESSION: Spondylosis as described. No evidence for fracture or malignancy. 
3.5 mm nodule in the right upper lobe apex. Dedicated chest CT recommended for 
further evaluation. 
RADIATION DOSE REDUCTION: All CT scans are performed using radiation dose 
reduction techniques, when applicable.  Technical factors are evaluated and 
adjusted to ensure appropriate moderation of exposure.  Automated dose 
management technology is applied to adjust the radiation doses to minimize 
exposure while achieving diagnostic quality images.

## 2020-01-21 IMAGING — CT CT ABDOMEN AND PELVIS WITH CONTRAST
2 of 3 series · 15 of 46 positions shown, 17 images · IV contrast (ISOVUE 300)
Comparison: There are no prior exam(s) available for comparison within the past 
12 months; however, comparison was made to the prior exam(s) dated  02/22/2017 
KUB

CT ABDOMEN AND PELVIS WITH CONTRAST, 01/21/2020 [DATE]: 
CLINICAL INDICATION:  Unintentional weight loss. 
A search for DICOM formatted images was conducted for prior CT imaging studies 
completed at a non-affiliated media free facility.
TECHNIQUE: The abdomen and pelvis were scanned with 100 ml of Isovue 300 
injected intravenously on a high-resolution CT scanner using dose reduction 
techniques. Routine MPR reconstructions were performed.

[Series 15: abd/pel ax w · axial · 0.68mm/px · z∈[-362,+64]mm · 12 of 164 slices shown, 14 images]
[im 11/164  soft-tissue]
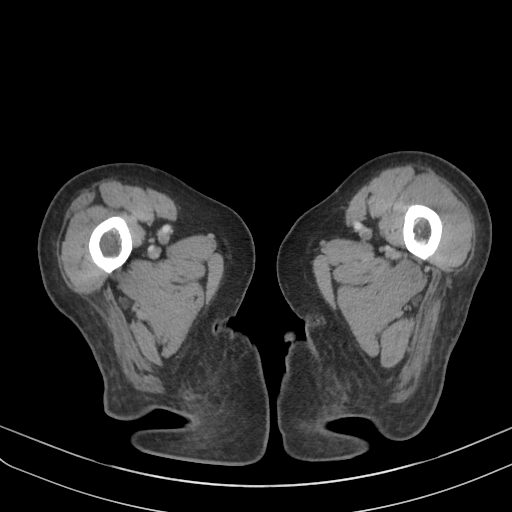
[im 11/164  bone]
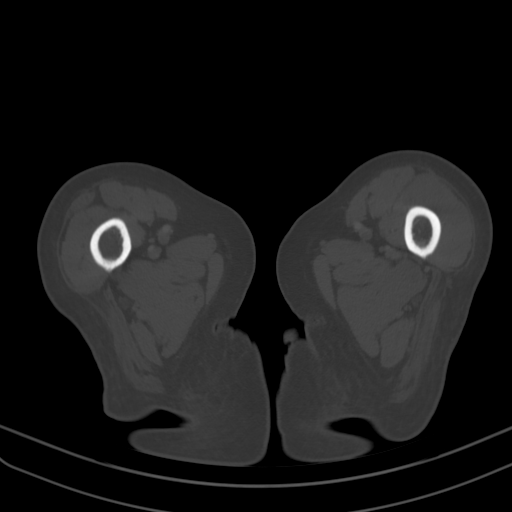
[im 22/164  soft-tissue]
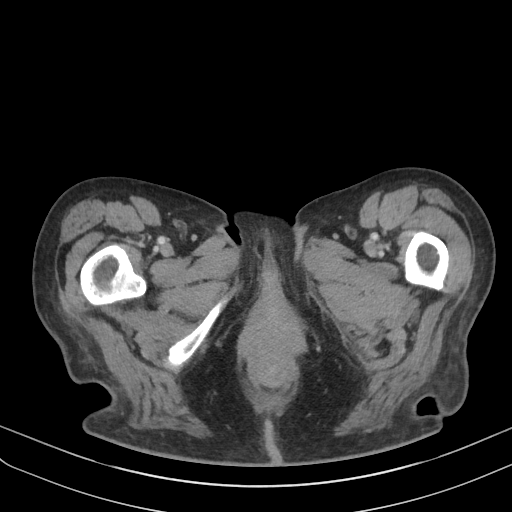
[im 37/164  soft-tissue]
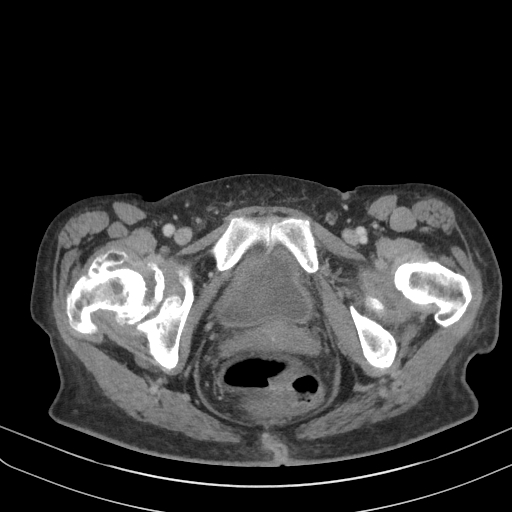
[im 48/164  soft-tissue]
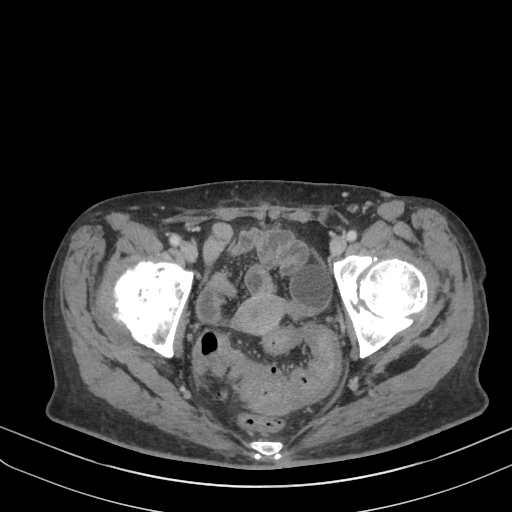
[im 64/164  soft-tissue]
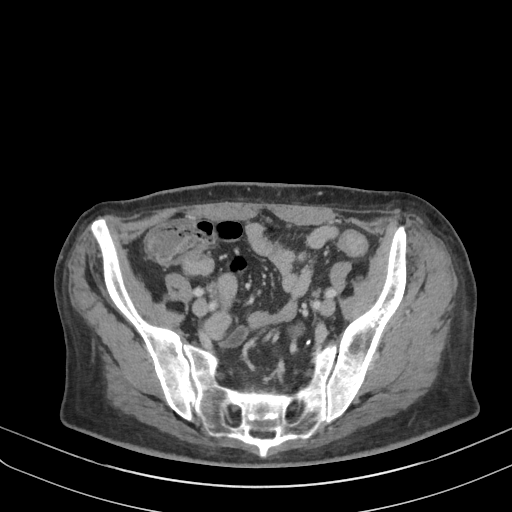
[im 74/164  soft-tissue]
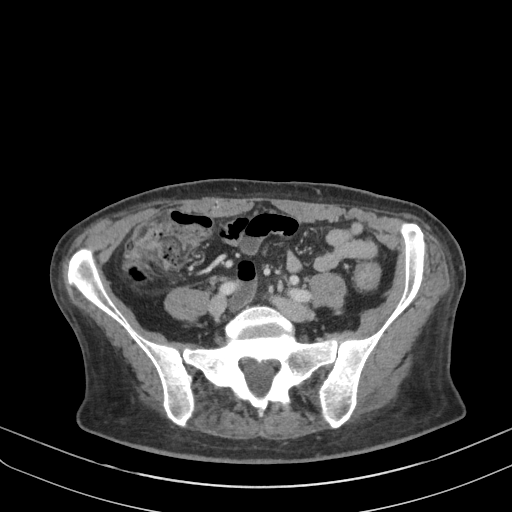
[im 90/164  soft-tissue]
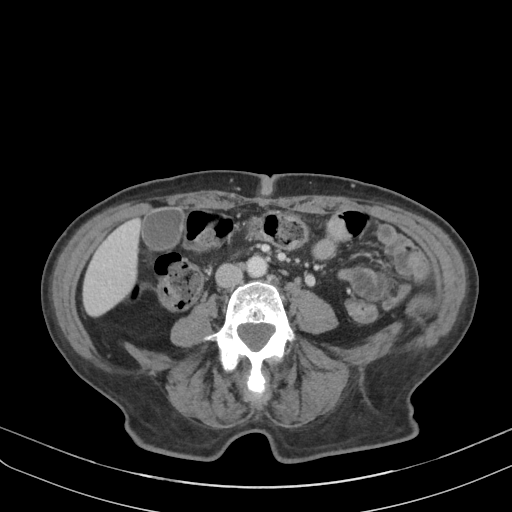
[im 100/164  soft-tissue]
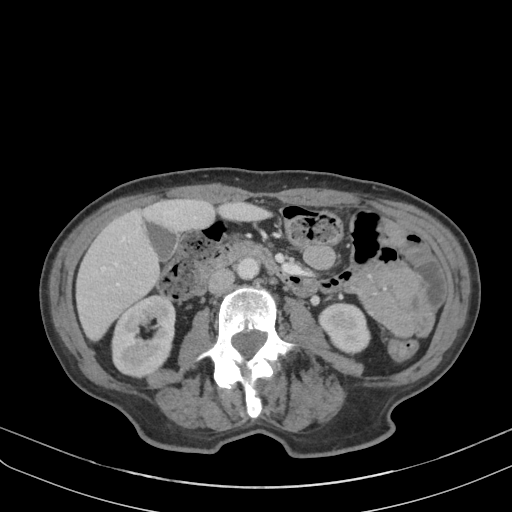
[im 116/164  soft-tissue]
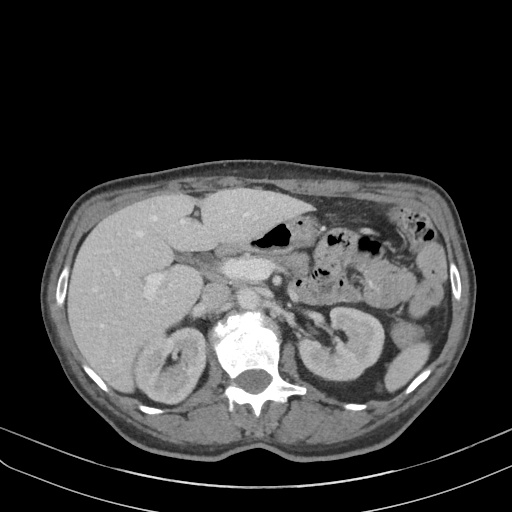
[im 116/164  bone]
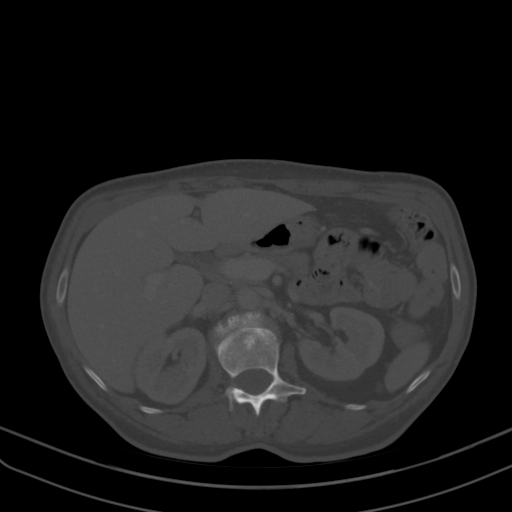
[im 127/164  soft-tissue]
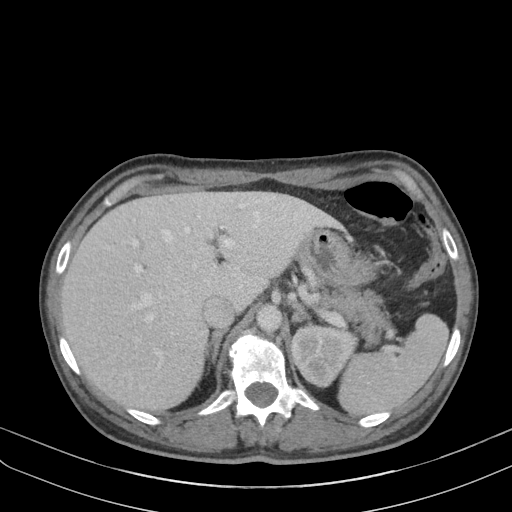
[im 142/164  soft-tissue]
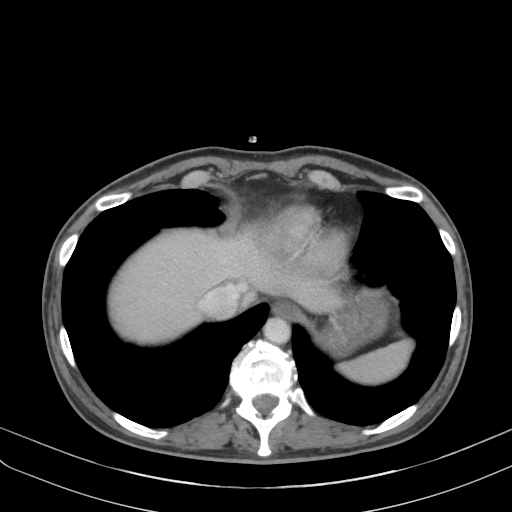
[im 153/164  soft-tissue]
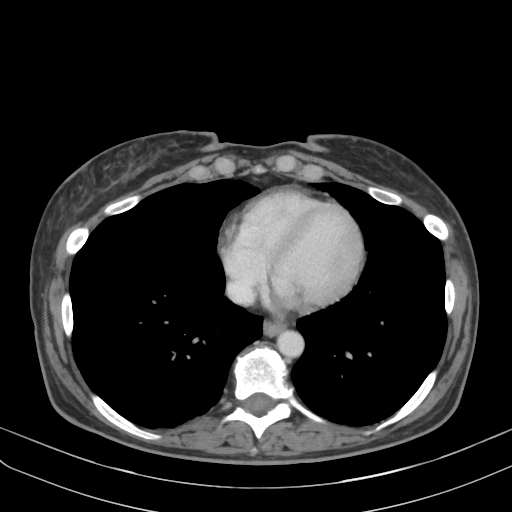

[Series 16: abd/pel cor w · coronal · 0.72mm/px · 3 of 118 slices shown]
[im 40/118  soft-tissue]
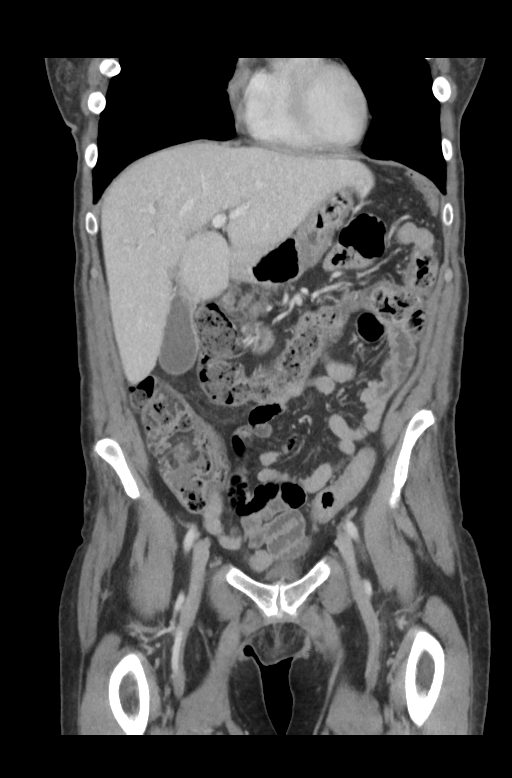
[im 53/118  soft-tissue]
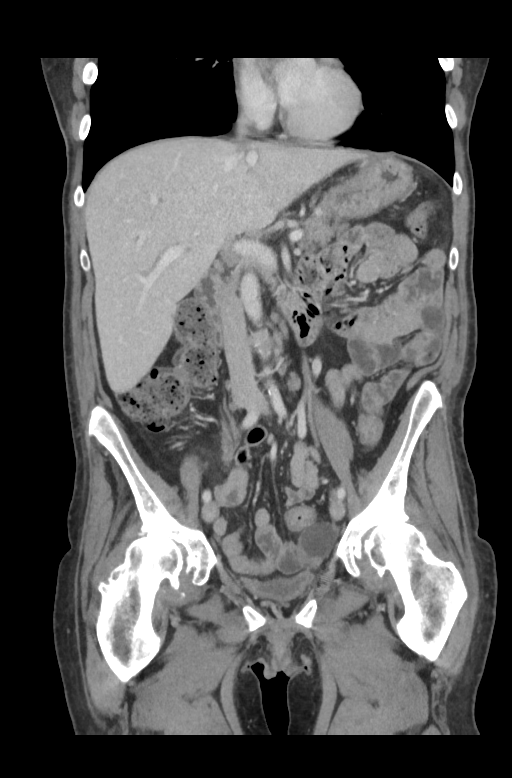
[im 66/118  soft-tissue]
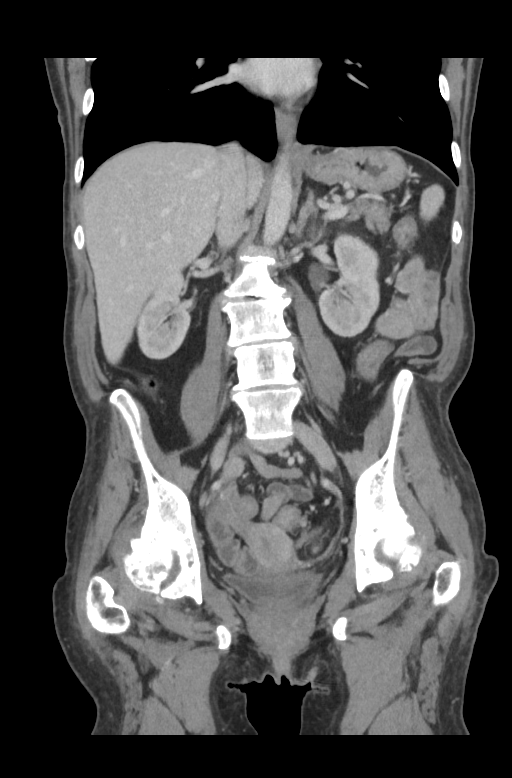

[15 of 46 positions shown; findings below may reference images not displayed]

FINDINGS: BREAST: Left breast skin thickening (up to 6 mm). Intermittent right breast skin 
thickening (up to 5 mm). 
LUNGS/PLEURA: Lungs are clear. No pleural effusions. No pneumothorax. 
HEART: Heart is normal in size and configuration. 
AORTA/VESSELS: Atherosclerosis. No aneurysm. 
LYMPH NODES: No lymphadenopathy. 
LIVER/BILE DUCTS: Tiny hepatic cysts. Focal fatty infiltration of the left lobe 
the liver along the falciform ligament. No mass or biliary dilatation. 
GALLBLADDER: No cholelithiasis or wall thickening. 
SPLEEN: Normal. 
PANCREAS: Normal. 
ADRENAL GLANDS: Normal. 
KIDNEYS: 0.9 cm indeterminate left upper pole renal hypodensity (25 HU). No mass 
or hydronephrosis. No calculi. 
STOMACH/BOWEL: No bowel wall thickening or obstruction. 
MESENTERY/PERITONEAL SPACE: No mass, free fluid or pneumoperitoneum. 
PELVIC ORGANS: 2.9 x 2.8 cm left adnexal cyst. 
MUSCULOSKELETAL: Marked degenerative change of the spine and hips. Mild rotatory 
dextrocurve. Degenerative change of the SI joints. No lytic or blastic lesions.
IMPRESSION: 1.  Breast skin thickening, more marked on the right: Mammography is recommended 
to exclude malignancy. 
2.  No adenopathy or osseous metastases. 
3.  Tiny hepatic cysts and focal fatty infiltration. 
4.  0.9 cm indeterminate left upper pole renal hypodensity. 
5.  2.9 x 2.8 cm left adnexal cyst. 
6.  Degenerative change. 
RADIATION DOSE REDUCTION: All CT scans are performed using radiation dose 
reduction techniques, when applicable. Technical factors are evaluated and 
adjusted to ensure appropriate moderation of exposure. Automated dose management 
technology is applied to adjust the radiation doses to minimize exposure while 
achieving diagnostic quality images.

## 2020-01-21 IMAGING — CT CT LUMBAR SPINE WITHOUT CONTRAST
3 of 4 series · 14 of 33 positions shown, 16 images · non-contrast
Comparison: There are no previous exams available for comparison.

CT LUMBAR SPINE WITHOUT CONTRAST, 01/21/2020 [DATE]: 
CLINICAL INDICATION: Back pain 
A search for DICOM formatted images was conducted for prior CT imaging studies 
completed at a non-affiliated media free facility.
TECHNIQUE: The lumbar spine was scanned from T12 through mid-sacrum without 
contrast on a high-resolution CT scanner using dose reduction techniques. 
RoutIne MPR reconstructions were performed.

[Series 9: l spine 2.0 b31s · axial · 0.37mm/px · z∈[-202,+4]mm · 6 of 135 slices shown, 8 images]
[im 21/135  soft-tissue]
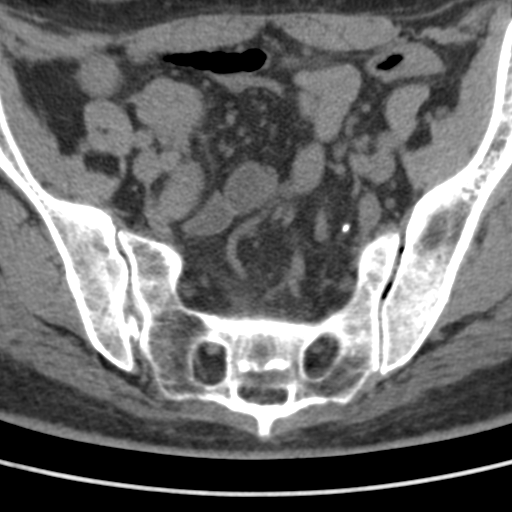
[im 21/135  bone]
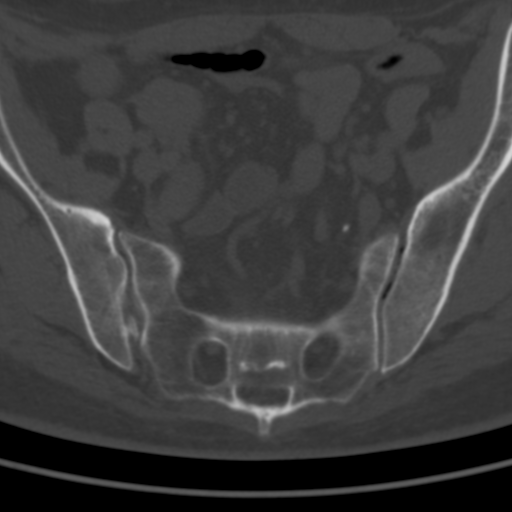
[im 42/135  bone]
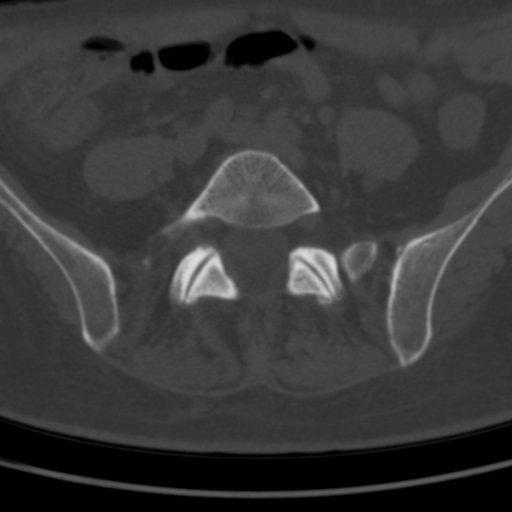
[im 62/135  bone]
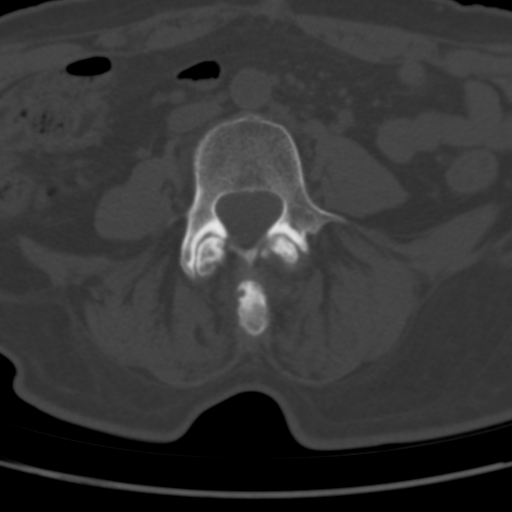
[im 83/135  bone]
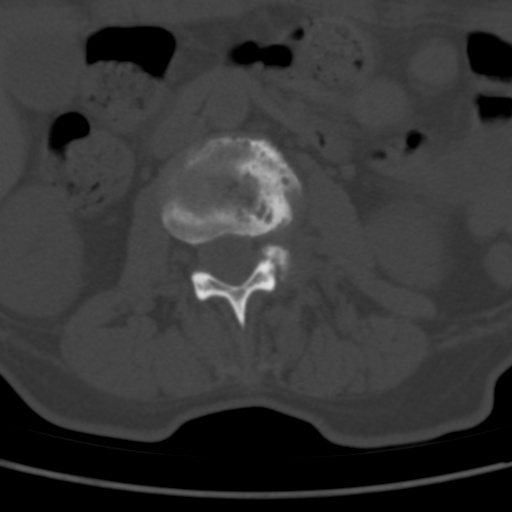
[im 104/135  soft-tissue]
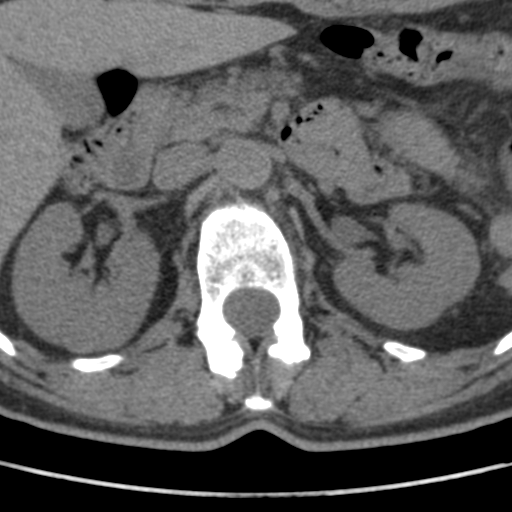
[im 104/135  bone]
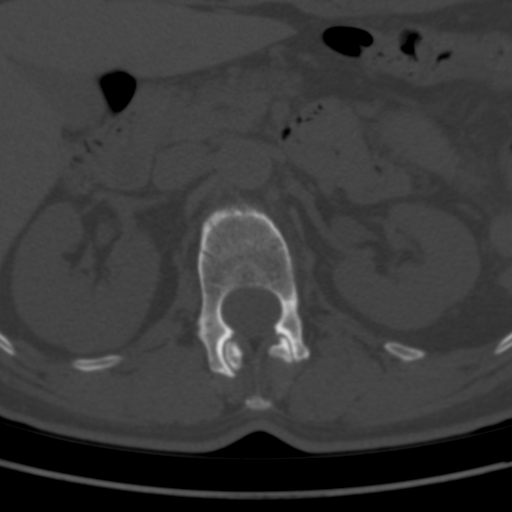
[im 124/135  bone]
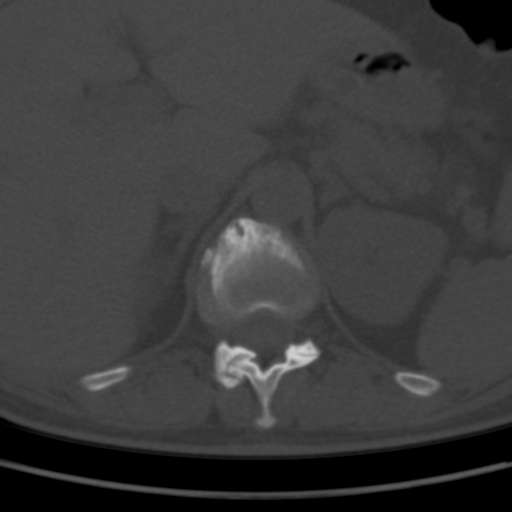

[Series 11: l spine coronal · coronal · 0.41mm/px · 3 of 83 slices shown]
[im 17/83  bone]
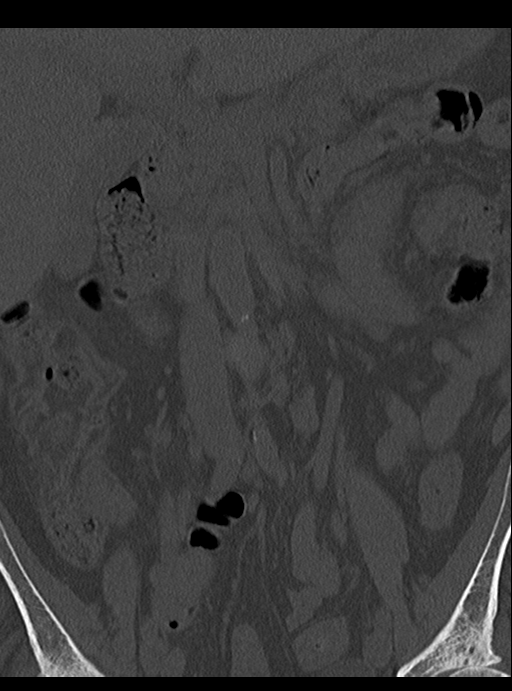
[im 33/83  bone]
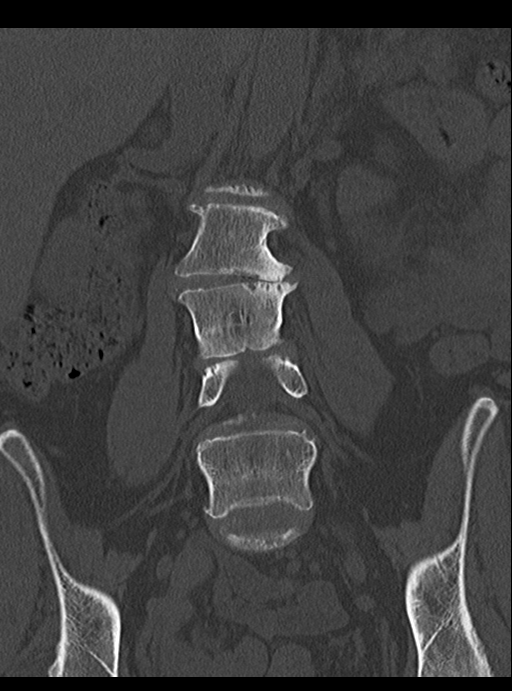
[im 50/83  bone]
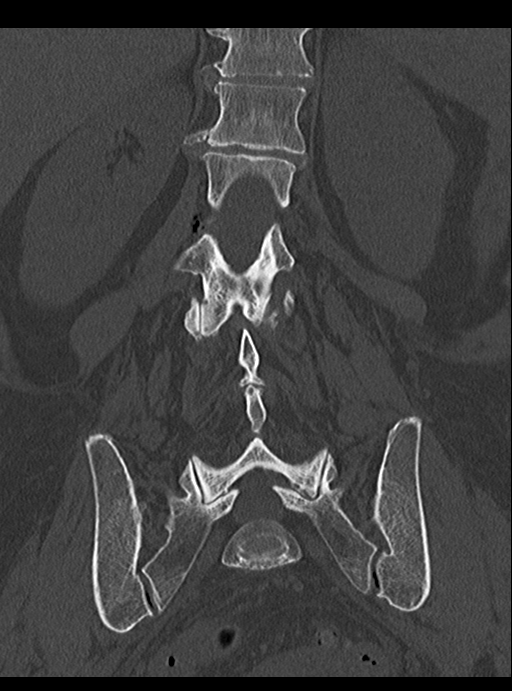

[Series 12: l spine sag · sagittal · 0.42mm/px · 5 of 98 slices shown]
[im 17/98  bone]
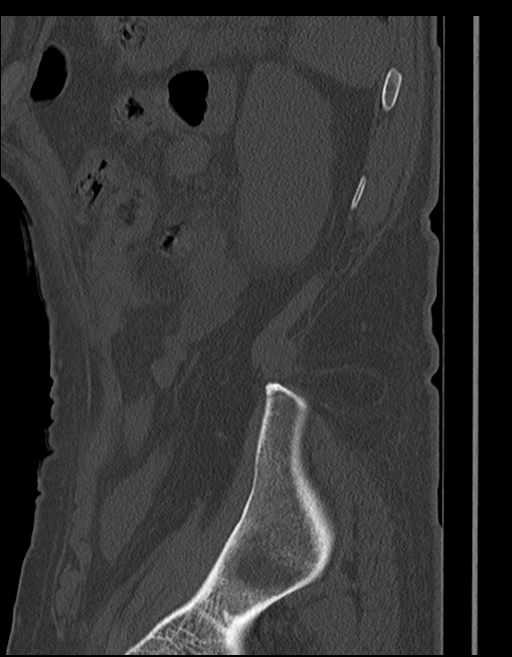
[im 33/98  bone]
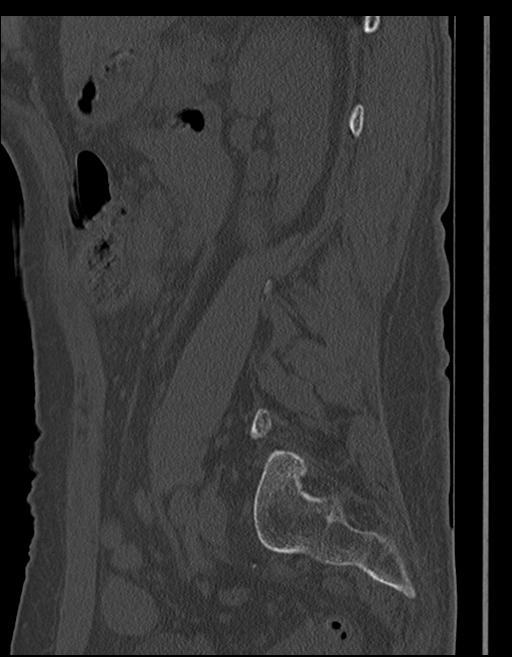
[im 49/98  bone]
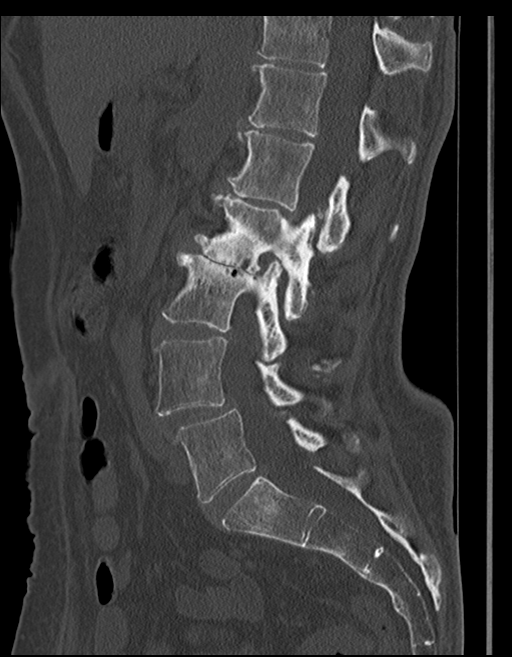
[im 65/98  bone]
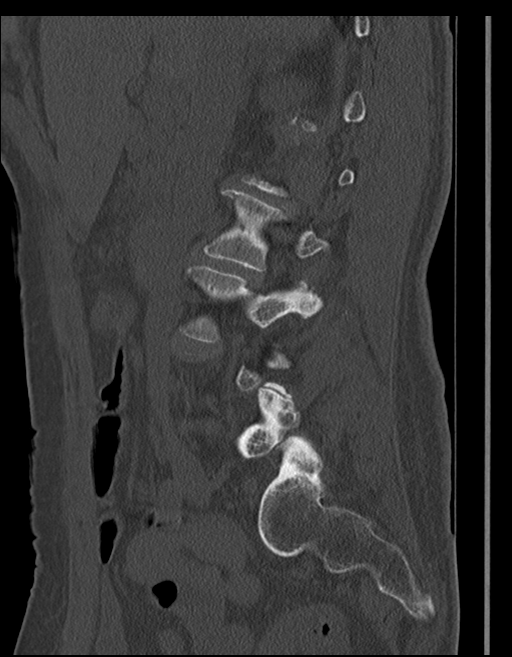
[im 81/98  bone]
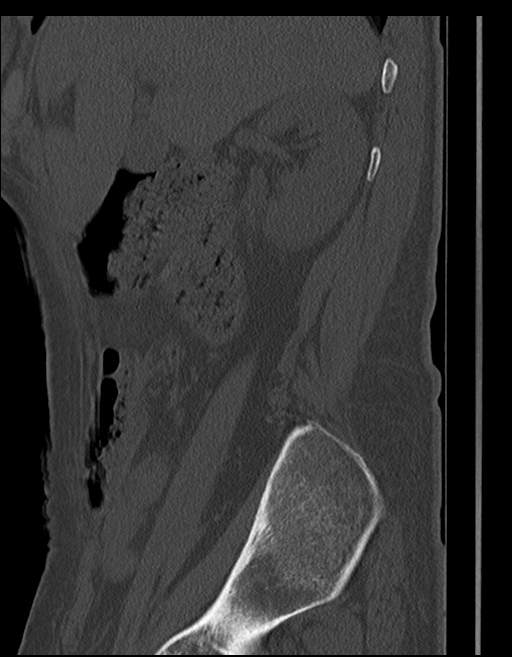

[14 of 33 positions shown; findings below may reference images not displayed]

FINDINGS: There is mild upper lumbar dextroscoliosis. 
Lumbar vertebral heights are intact. No evidence for fracture or bone 
destruction. 
At L5-S1 the canal and foramina appear open. 
At L4-5 there is less than grade 1 degenerative anterolisthesis. No pars defect. 
The canal and foramina remain open. 
At L3-4 there is disc bulge, worse on the right, mildly effacing the right 
ventral thecal sac and encroaching on the exiting right L4 nerve root. Foramina 
appear open. 
At L2-3 there is marked disc narrowing along the left interspace with endplate 
irregularity and small Schmorl's nodes. There is less than grade 1 
retrolisthesis. Disc-osteophyte at this level contributes to mild canal 
stenosis, worse on the left. There is moderate left foraminal stenosis. 
At L1-2 there is less than grade 1 retrolisthesis without significant canal or 
foraminal stenosis.
IMPRESSION: Degenerative changes as described. There is mild right-sided canal stenosis at 
L3-4 encroaching on the right L4 nerve root. No evidence for fracture or bone 
destruction. 
Less than grade 1 anterolisthesis at L4-5, and slight retrolisthesis at L1-2 and 
L2-3. 
Mild upper lumbar dextroscoliosis. 
RADIATION DOSE REDUCTION: All CT scans are performed using radiation dose 
reduction techniques, when applicable.  Technical factors are evaluated and 
adjusted to ensure appropriate moderation of exposure.  Automated dose 
management technology is applied to adjust the radiation doses to minimize 
exposure while achieving diagnostic quality images.

## 2020-02-26 IMAGING — PT PET CT SCAN TUMOR IMAGING SKULL TO THIGH
6 series · 25 of 25 positions shown · non-contrast
Comparison: CT 01/21/2020

PET CT SCAN TUMOR IMAGING SKULL TO THIGH, 02/26/2020 [DATE]: 
CLINICAL INDICATION:  Pulmonary nodules noted on recent neck CT. Unintentional 
weight loss
TECHNIQUE: A dose of 12.5 millicuries of 18-FDG was administered intravenously 
and skull to thigh PET scanning was performed at 65 minutes. Tomographic scans 
were reconstructed in axial, coronal, and sagittal projections. The data was 
reconstructed into a three-dimensional volume rendered image and reviewed in a 
rotational cine loop. Serum blood glucose at the time of injection was 160 
mg/dl.

[Series 201: body-low dose ct, idose (4) · axial · 4.0mm · 1.17mm/px · z∈[-979,-131]mm · 4 of 213 slices shown]
[im 1/213]
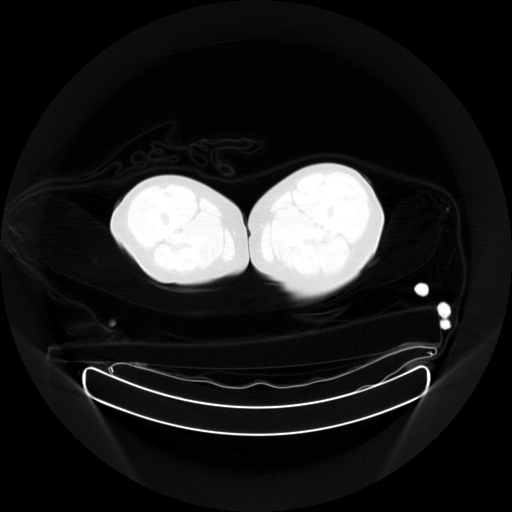
[im 71/213]
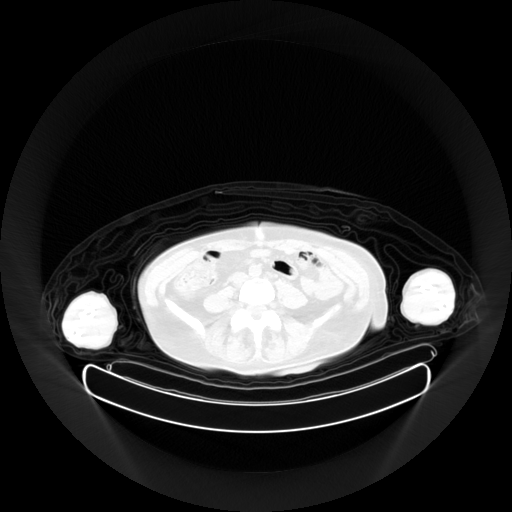
[im 142/213]
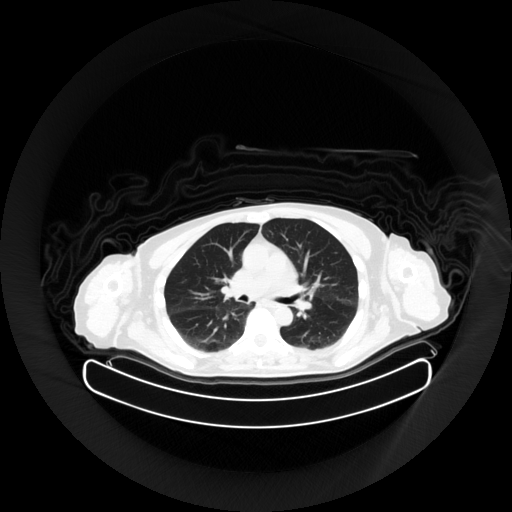
[im 213/213]
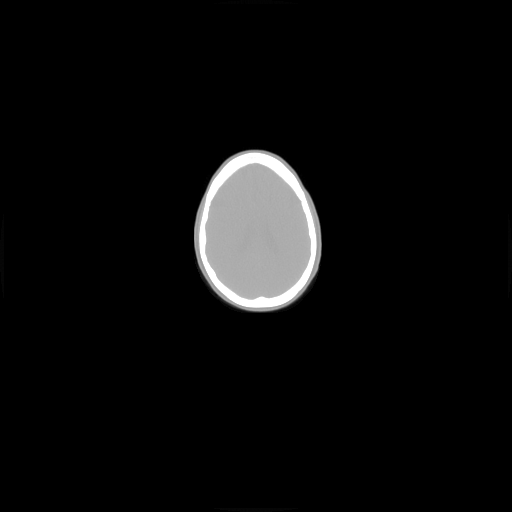

[Series 202: lung ct, idose (2) · axial · 2.0mm · 1.17mm/px · z∈[-656,-310]mm · 3 of 174 slices shown]
[im 1/174]
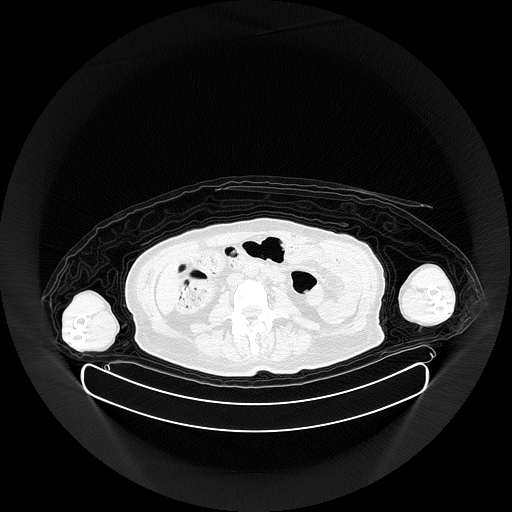
[im 87/174]
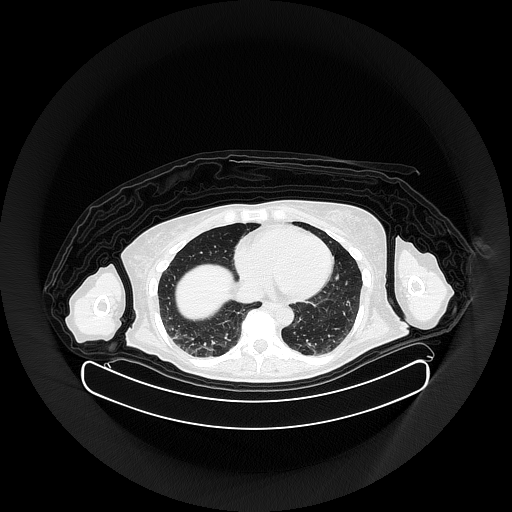
[im 174/174]
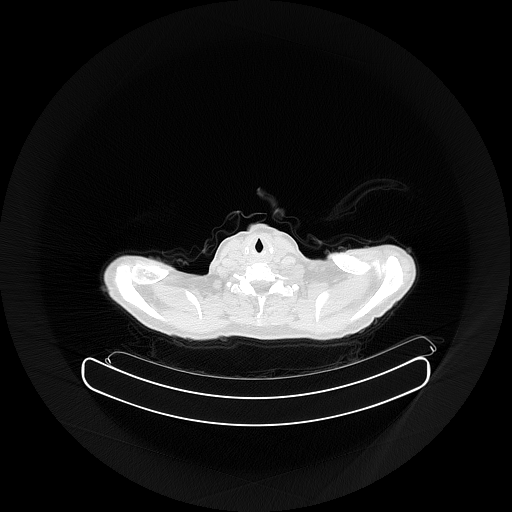

[Series 5507: (wb_nac) body · axial · 4.0mm · 4.00mm/px · z∈[-979,-131]mm · 5 of 213 slices shown]
[im 1/213]
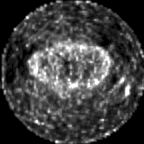
[im 54/213]
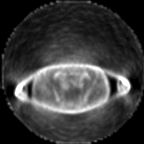
[im 107/213]
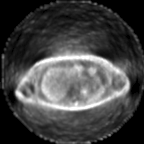
[im 160/213]
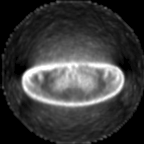
[im 213/213]
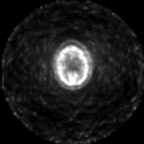

[Series 5508: (wb_ctac) body · axial · 4.0mm · 4.00mm/px · z∈[-979,-131]mm · 5 of 213 slices shown]
[im 1/213]
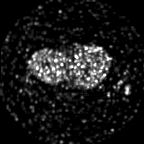
[im 54/213]
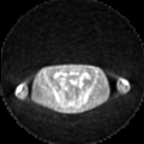
[im 107/213]
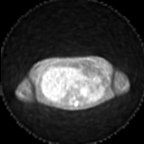
[im 160/213]
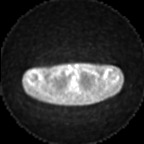
[im 213/213]
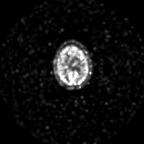

[Series 5510: (detailwb_nac_psf) body · axial · 2.0mm · 2.00mm/px · z∈[-656,-310]mm · 4 of 174 slices shown]
[im 1/174]
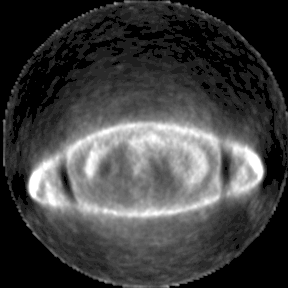
[im 58/174]
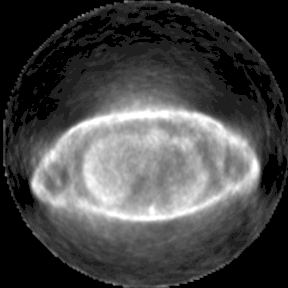
[im 116/174]
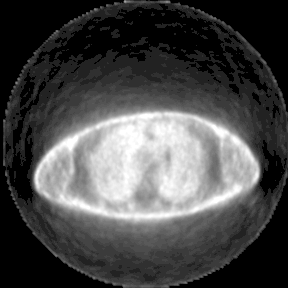
[im 174/174]
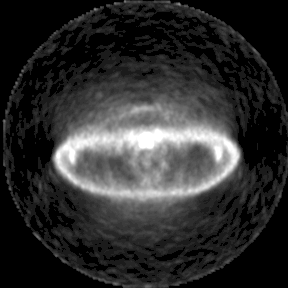

[Series 5511: (detailwb_ctac_psf) body · axial · 2.0mm · 2.00mm/px · z∈[-656,-310]mm · 4 of 174 slices shown]
[im 1/174]
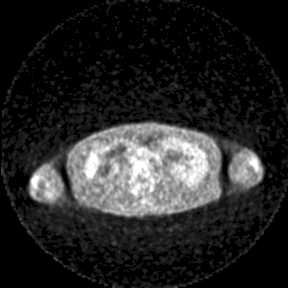
[im 58/174]
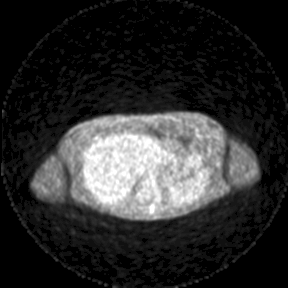
[im 116/174]
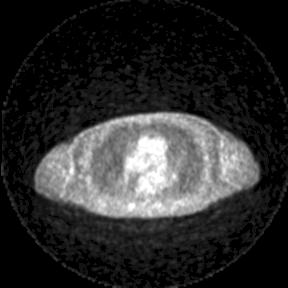
[im 174/174]
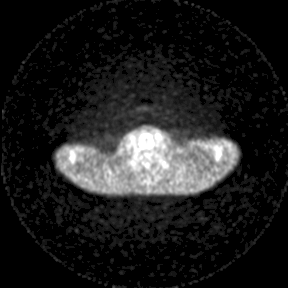

[25 of 25 positions shown; findings below may reference images not displayed]

FINDINGS: NECK/CHEST: Physiologic uptake involving the left scalene muscles. No abnormal 
activity involving the tonsillar regions, lungs or chest wall. 
ABDOMEN/PELVIS: No abnormal FDG uptake. 
MUSCULOSKELETAL: Degenerative type uptake involving both hips and shoulders. 
ADDITIONAL CT FUSION FINDINGS: 3.3 cm left adnexal cyst. There is no associated 
FDG uptake along the periphery of the cystic lesion. Scattered colonic 
diverticula.
IMPRESSION: 1. No abnormal FDG uptake identified. 
2. 3.3 cm left adnexal cyst. Recommend dedicated pelvic ultrasound confirm 
simple cystic nature.

## 2020-03-08 IMAGING — MR MRI RIGHT KNEE WITHOUT CONTRAST
4 of 6 series · 21 of 40 positions shown · IV contrast (gadolinium)
Comparison: None.

MRI RIGHT KNEE WITHOUT CONTRAST, 03/08/2020 [DATE]: 
CLINICAL INDICATION: Chronic anterior right knee pain.
TECHNIQUE: Multiplanar, multiecho position MR images of the right knee were 
performed without intravenous gadolinium enhancement.

[Series 101: survey_fullfov_transversal · axial · 10.0mm · 1.84mm/px · z∈[-82,+82]mm · 2 of 12 slices shown]
[im 1/12]
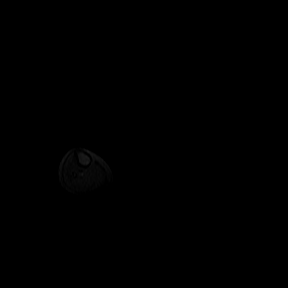
[im 12/12]
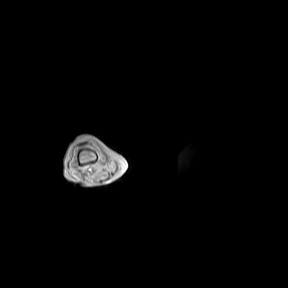

[Series 201: survey_right · axial · 10.0mm · 0.94mm/px · z∈[-40,+150]mm · 4 of 15 slices shown]
[im 1/15]
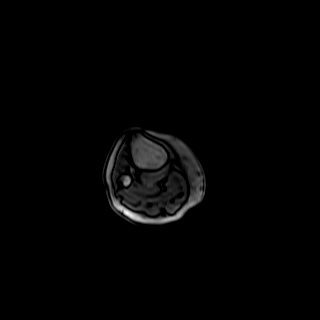
[im 5/15]
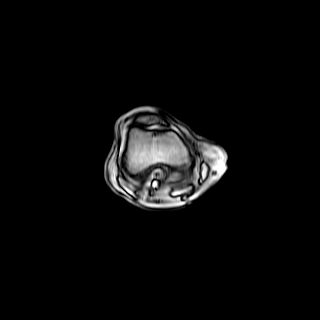
[im 10/15]
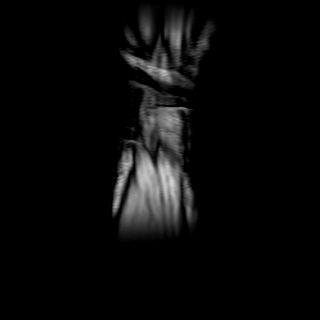
[im 15/15]
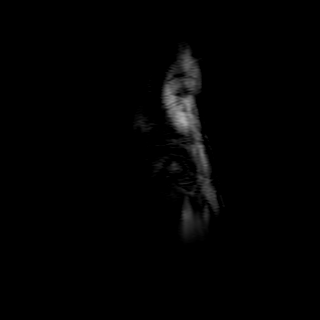

[Series 301: pdw/spair_sag-new · sagittal · 3.0mm · 0.31mm/px · 7 of 31 slices shown]
[im 1/31]
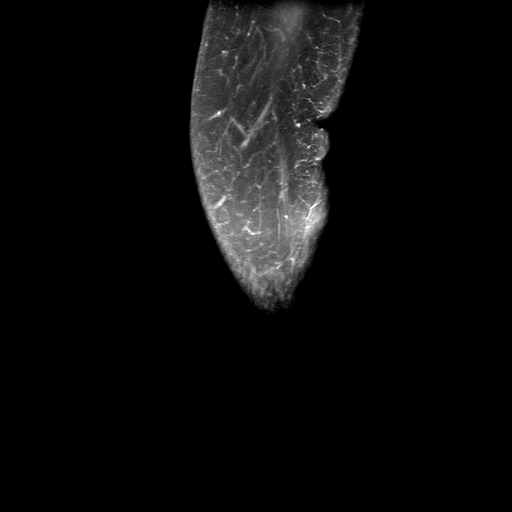
[im 5/31]
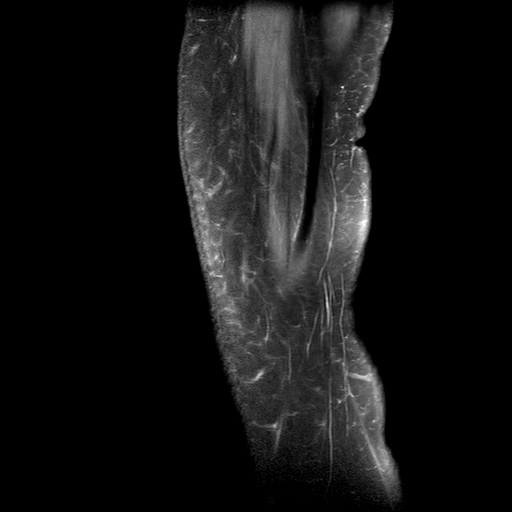
[im 9/31]
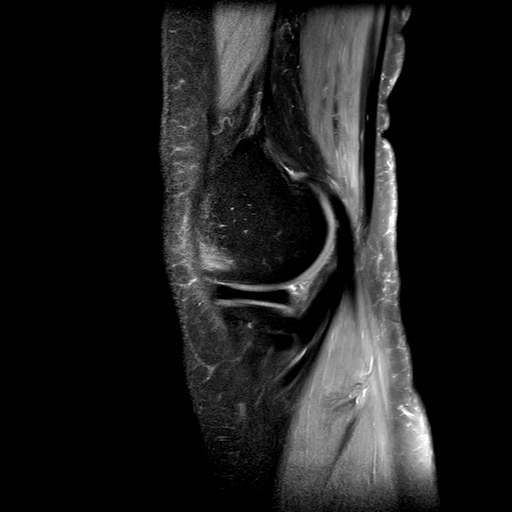
[im 13/31]
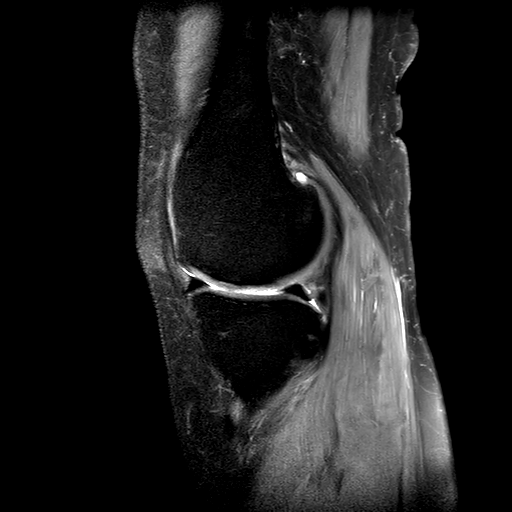
[im 18/31]
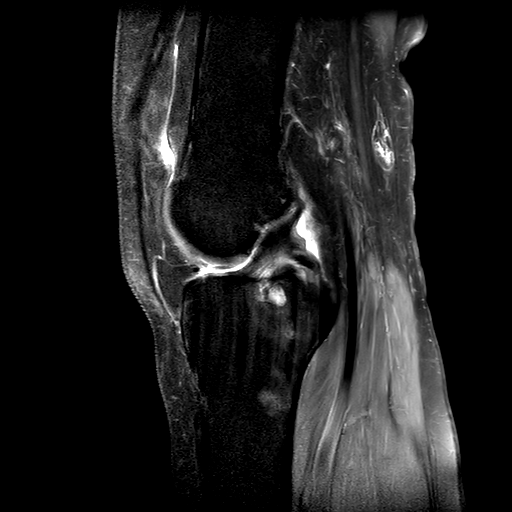
[im 22/31]
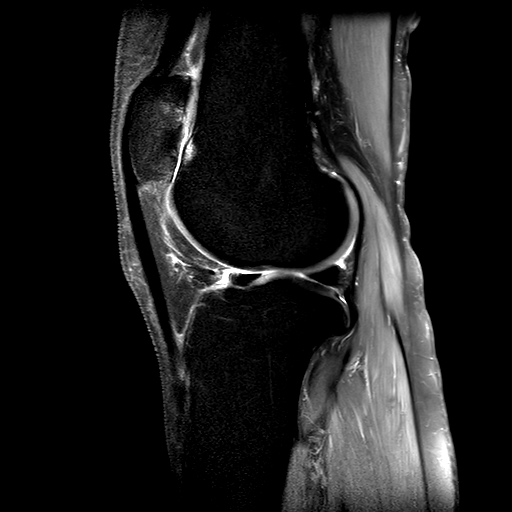
[im 26/31]
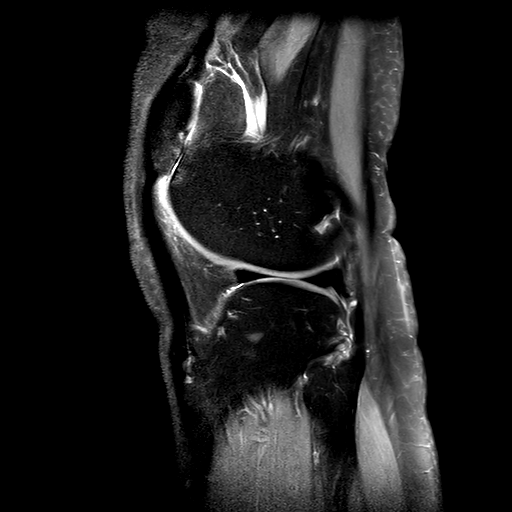

[Series 601: T1 · coronal · 3.0mm · 0.29mm/px · 8 of 29 slices shown]
[im 1/29]
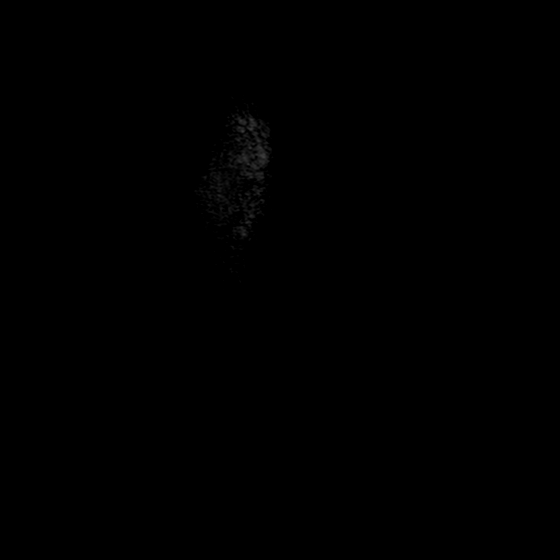
[im 5/29]
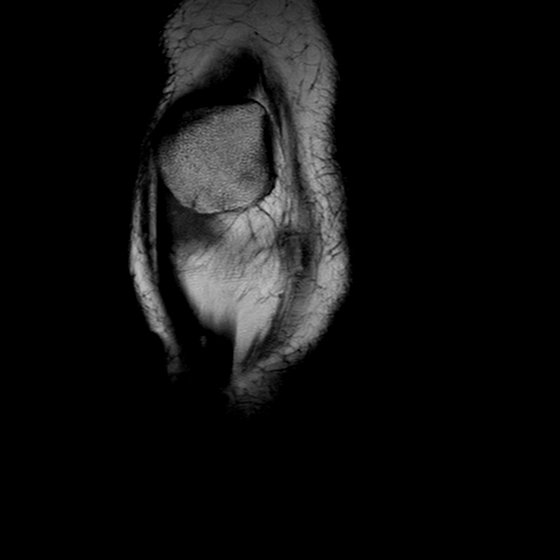
[im 9/29]
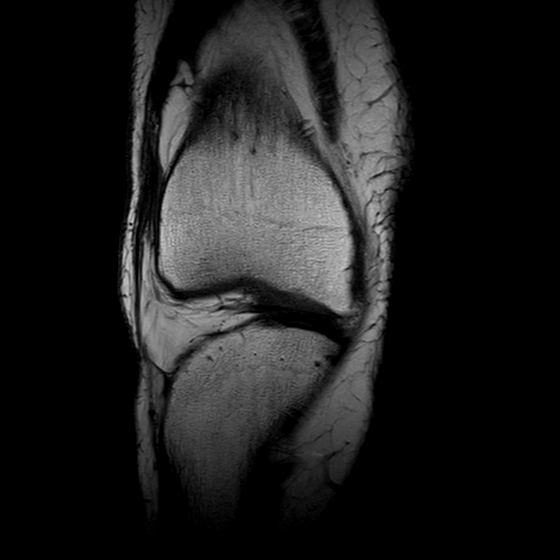
[im 13/29]
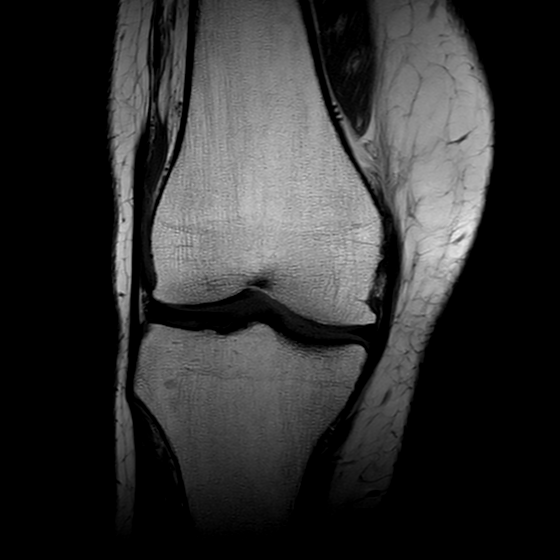
[im 17/29]
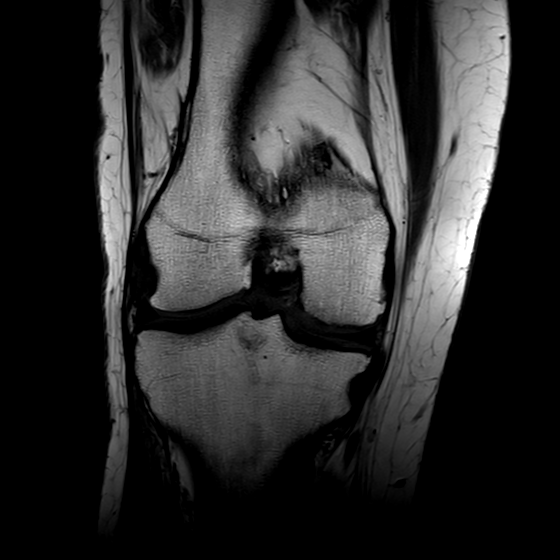
[im 21/29]
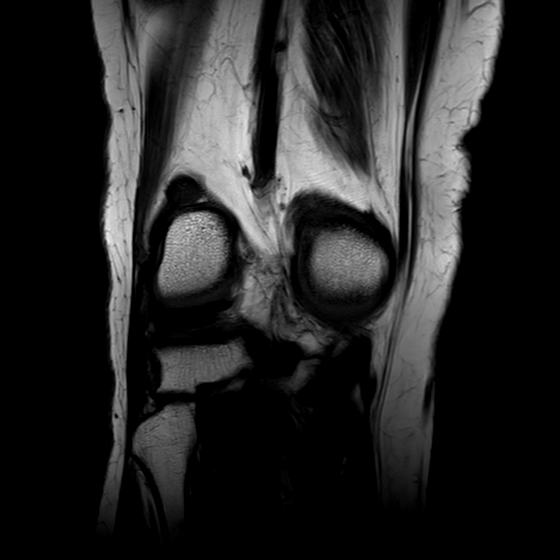
[im 25/29]
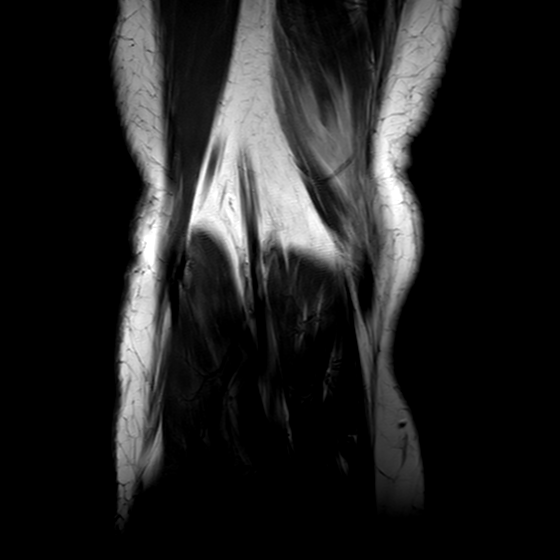
[im 29/29]
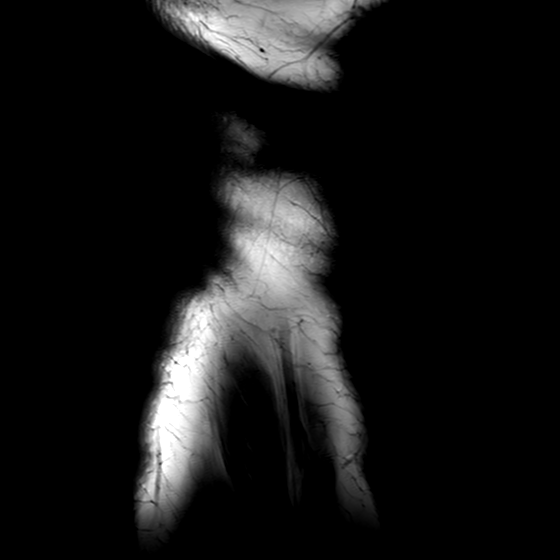

[21 of 40 positions shown; findings below may reference images not displayed]

FINDINGS: MEDIAL COMPARTMENT: The medial meniscus is intact without tear or extrusion.
x 0.4 cm focus of grade III chondromalacia and cartilage fissuring of the 
weightbearing surface of the medial femoral condyle with tiny subcortical cysts. 
Small osteophytes. 
LATERAL COMPARTMENT: The lateral meniscus is intact without tear or extrusion. 
Articular cartilage is preserved. Small osteophytes. 
PATELLOFEMORAL COMPARTMENT: The patella is centrally located. Mild lateral 
patellar subluxation. Up to grade IV chondromalacia and subcortical cystic 
change, most marked laterally. Patella alta (1.6 Insall-Salvati ratio). 
PROXIMAL TIBIOFIBULAR JOINT: Preserved. 
LIGAMENTS: The anterior cruciate ligament is intact. The posterior cruciate 
ligament is intact. The medial collateral ligament and lateral collateral 
ligaments are preserved. 
EXTENSOR MECHANISM: 0.5 cm ossification within the deep central distal patellar 
tendon. No patellar or quadriceps tendon tear. The medial and lateral retinacula 
are intact. 
POSTEROMEDIAL CORNER: The semimembranosus and pes anserine tendons are 
preserved. The posterior oblique ligament and posterior medial joint capsule are 
intact. 
POSTEROLATERAL CORNER: The popliteal tendon and popliteofibular ligament are 
intact. The biceps femoris is negative. 
BONES AND SOFT TISSUES: Bone marrow signal intensity is negative for fracture or 
contusion. 1 cm central tibial plateau subcortical cyst. 1.0 x 0.8 x 0.1 cm 
intra-articular body in the anterior intercondylar notch, likely cartilaginous 
fragment. No joint effusion. No popliteal cyst. The musculature is symmetric 
without mass, signal abnormality or atrophy. Neurovascular bundles are negative. 
Subcutaneous tissues are negative.
IMPRESSION: 1.  Marked patellofemoral degenerative change, patella alta and 0.5 cm 
ossification in the distal patellar tendon. 
2.  Moderate medial compartment chondromalacia and minimal lateral compartment 
degenerative change.

## 2020-03-08 IMAGING — MR MRI LEFT KNEE WITHOUT CONTRAST
4 of 6 series · 22 of 40 positions shown · IV contrast (gadolinium)
Comparison: None.

MRI LEFT KNEE WITHOUT CONTRAST, 03/08/2020 [DATE]: 
CLINICAL INDICATION: Chronic anterior left knee pain
TECHNIQUE: Multiplanar, multiecho position MR images of the left knee were 
performed without intravenous gadolinium enhancement.

[Series 101: survey_fullfov_transversal · axial · 10.0mm · 1.84mm/px · z∈[-82,+82]mm · 2 of 12 slices shown]
[im 1/12]
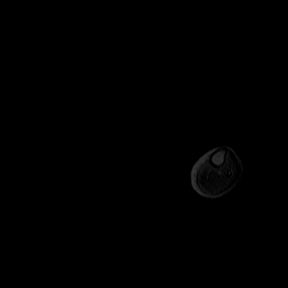
[im 12/12]
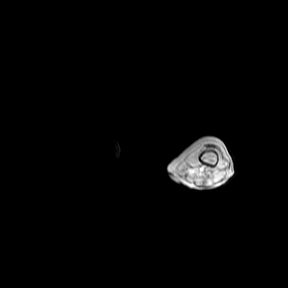

[Series 201: survey_left · axial · 10.0mm · 0.94mm/px · z∈[-40,+150]mm · 4 of 15 slices shown]
[im 1/15]
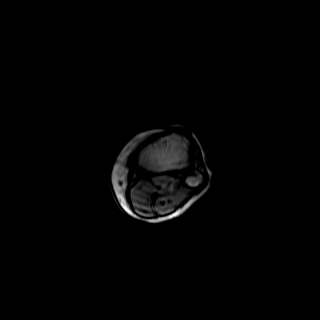
[im 5/15]
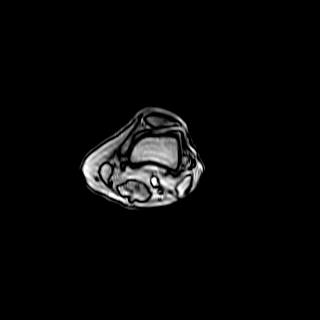
[im 10/15]
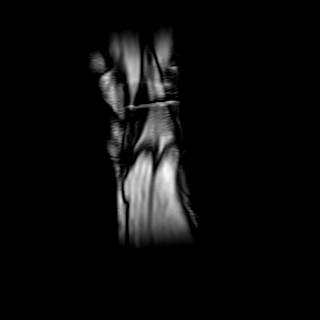
[im 15/15]
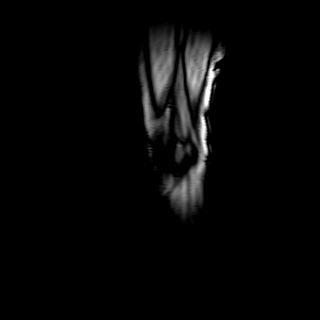

[Series 301: pdw spair ax · axial · 3.0mm · 0.29mm/px · z∈[-75,+57]mm · 8 of 38 slices shown]
[im 1/38]
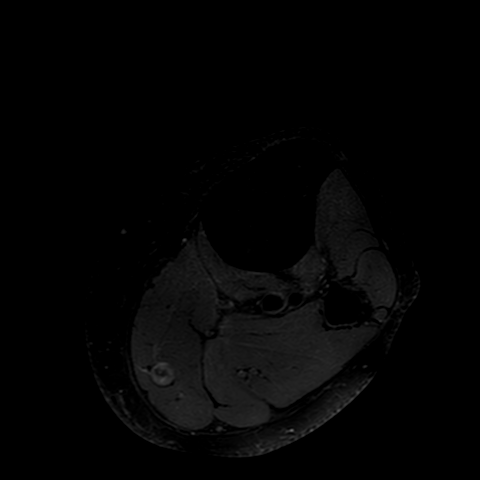
[im 5/38]
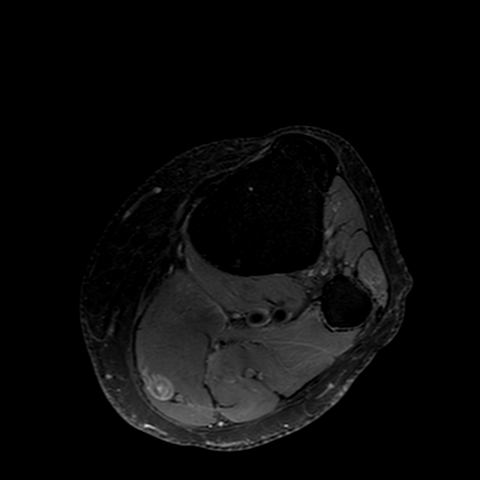
[im 13/38]
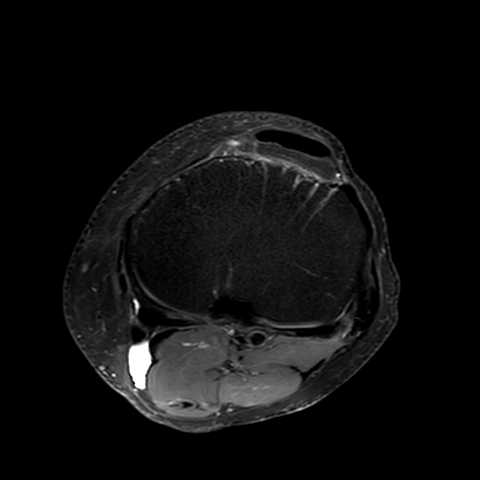
[im 17/38]
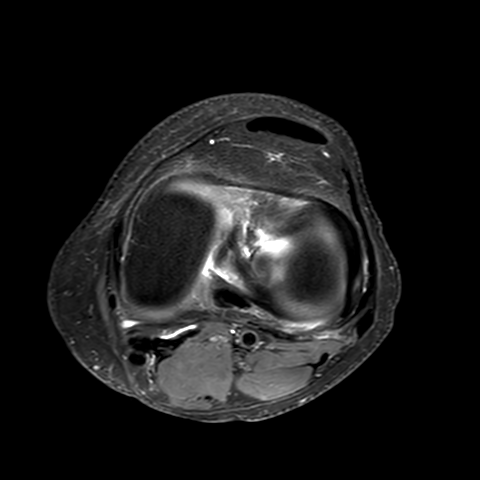
[im 21/38]
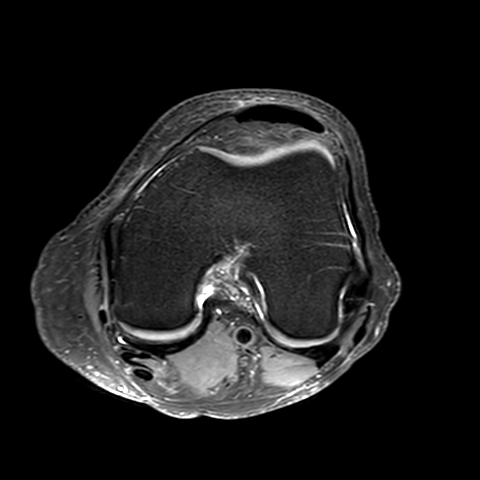
[im 25/38]
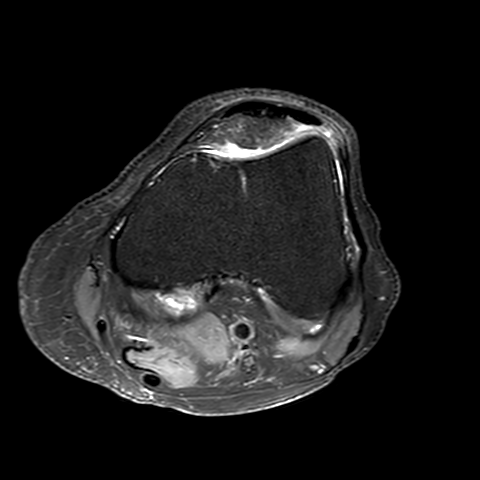
[im 33/38]
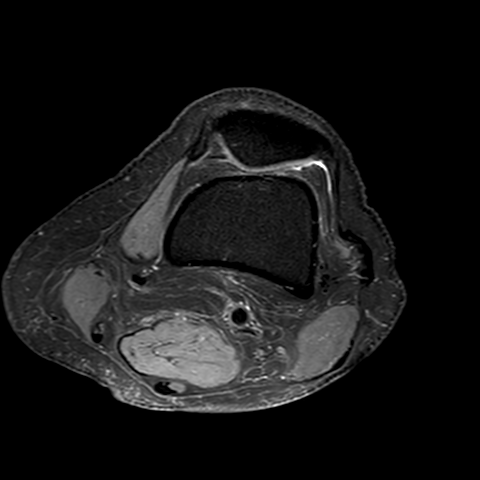
[im 38/38]
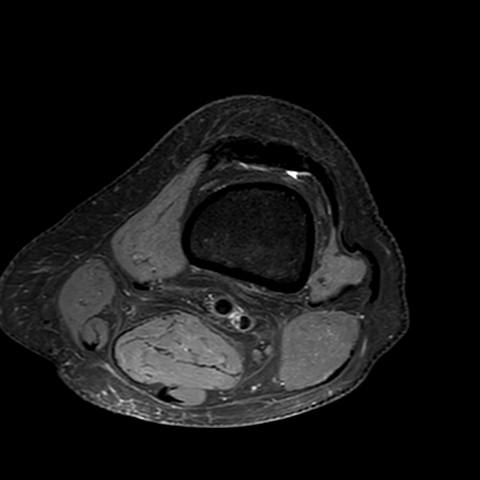

[Series 601: T1 · coronal · 3.0mm · 0.29mm/px · 8 of 29 slices shown]
[im 1/29]
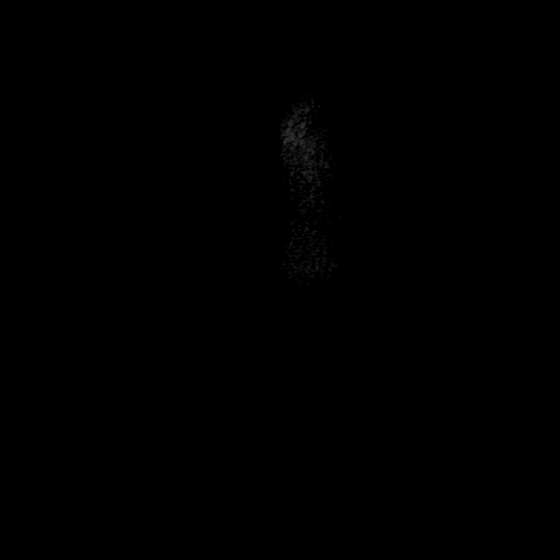
[im 5/29]
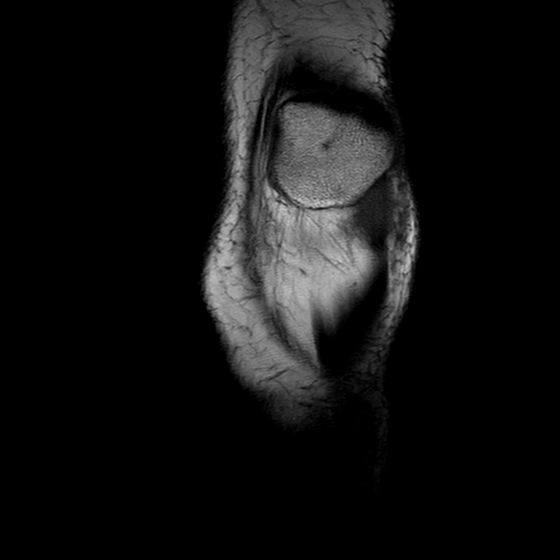
[im 9/29]
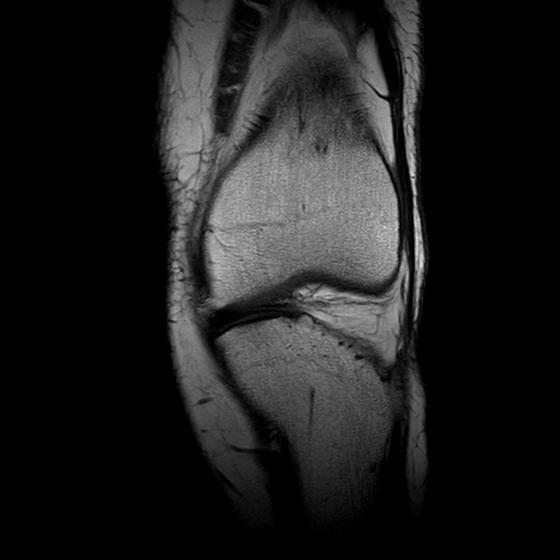
[im 13/29]
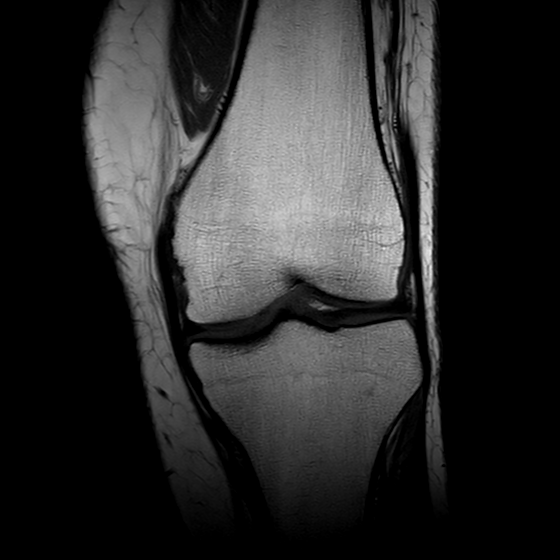
[im 17/29]
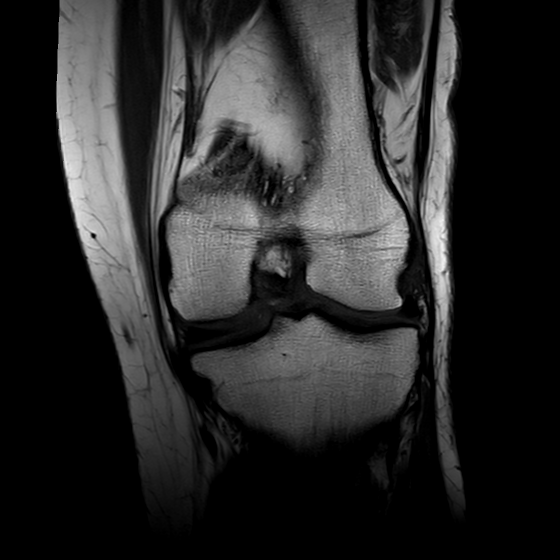
[im 21/29]
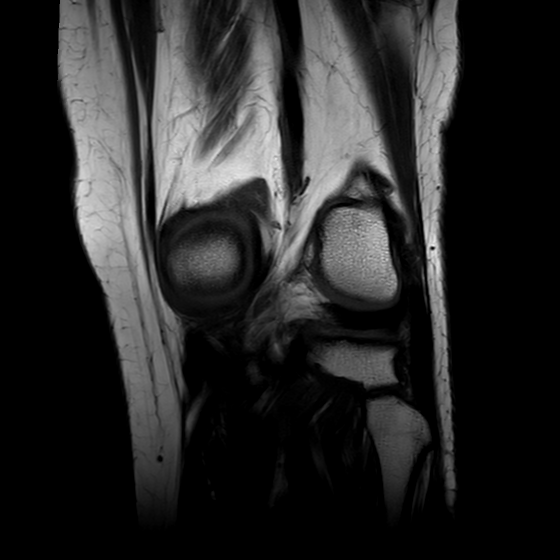
[im 25/29]
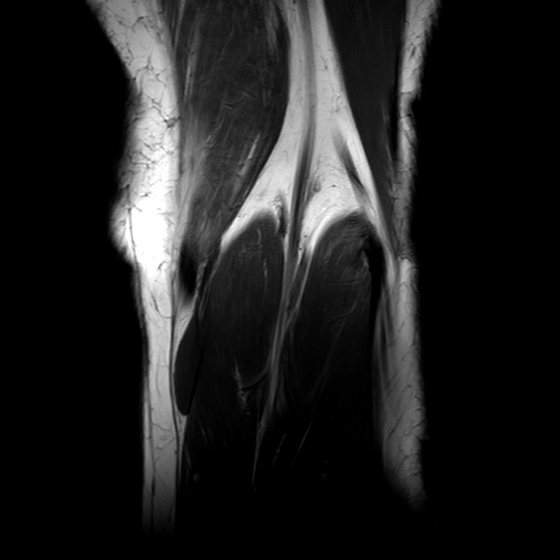
[im 29/29]
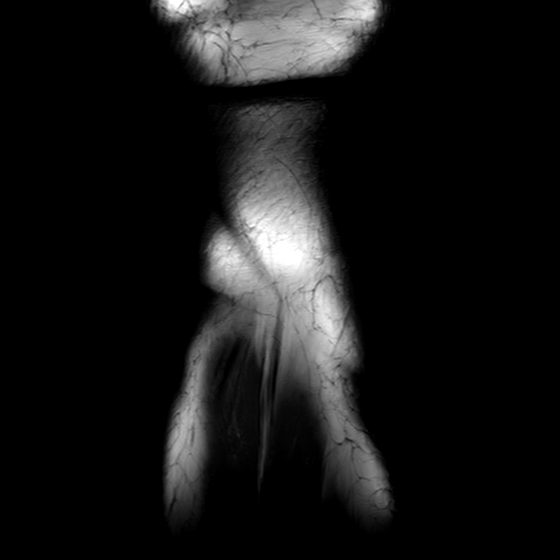

[22 of 40 positions shown; findings below may reference images not displayed]

FINDINGS: MEDIAL COMPARTMENT: The medial meniscus is intact without tear or extrusion. 1 
cm segment of grade III chondromalacia/cartilage fissuring of the weightbearing 
surface of the medial femoral condyle. 
LATERAL COMPARTMENT: The lateral meniscus is intact without tear or extrusion. 
Articular cartilage is preserved. 
PATELLOFEMORAL COMPARTMENT: The patella is centrally located. Up to grade IV 
chondromalacia and subcortical cystic change of the central patella. No focal 
trochlear cartilaginous lesions. Patella alta (1.7 cm Insall-Salvati ratio). 
PROXIMAL TIBIOFIBULAR JOINT: Degenerative change with chondromalacia and 0.5 cm 
fibular head subcortical cyst. 
LIGAMENTS: The anterior cruciate ligament is intact. The posterior cruciate 
ligament is intact. The medial collateral ligament and lateral collateral 
ligaments are preserved. 
EXTENSOR MECHANISM: The quadriceps and patellar tendon are preserved. The medial 
and lateral retinacula are intact. 
POSTEROMEDIAL CORNER: The semimembranosus and pes anserine tendons are 
preserved. The posterior oblique ligament and posterior medial joint capsule are 
intact. 
POSTEROLATERAL CORNER: The popliteal tendon and popliteofibular ligament are 
intact. The biceps femoris is negative. 
BONES AND SOFT TISSUES: Bone marrow signal intensity is negative for fracture or 
contusion. No joint effusion. 1.6 x 0.7 x 3.0 cm popliteal cyst. The musculature 
is symmetric without mass, signal abnormality or atrophy. Neurovascular bundles 
are negative. Subcutaneous tissues are negative.
IMPRESSION: 1.  Marked patellar degenerative change and patella alta. 
2.  Moderate medial femoral condylar chondromalacia. 
3.  Degenerative change of the proximal tibiofibular joint. 
4.  Small popliteal cyst.

## 2020-03-21 IMAGING — MR MRI CERVICAL SPINE WITHOUT CONTRAST
4 of 6 series · 24 of 48 positions shown · IV contrast (gadolinium)
Comparison: CT examination of the cervical spine of 01/21/2020

MRI CERVICAL SPINE WITHOUT CONTRAST, 03/21/2020 [DATE]: 
CLINICAL INDICATION: Chronic neck and low back pain for 3 years. Occasional pain 
into bilateral arms. Decreased range of motion.
TECHNIQUE: Multiplanar, multiecho position MR images of the cervical spine were 
performed without intravenous gadolinium enhancement.

[Series 101: survey* · axial · 10.0mm · 1.56mm/px · z∈[-30,+199]mm · 7 of 15 slices shown]
[im 1/15]
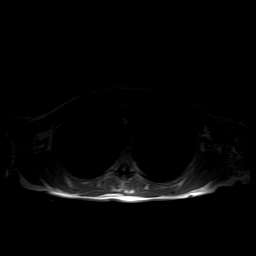
[im 3/15]
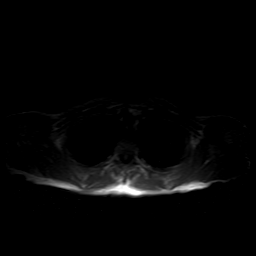
[im 5/15]
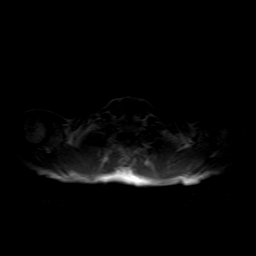
[im 8/15]
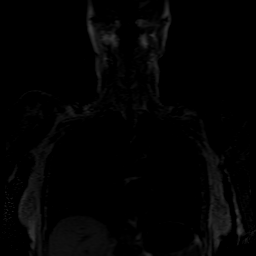
[im 10/15]
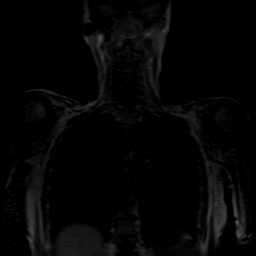
[im 12/15]
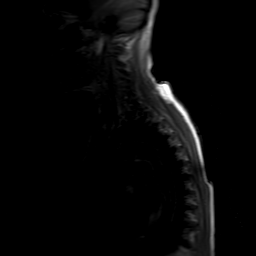
[im 15/15]
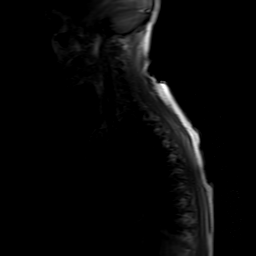

[Series 201: t2w_cor-surv · coronal · 5.0mm · 0.69mm/px · 6 of 11 slices shown]
[im 1/11]
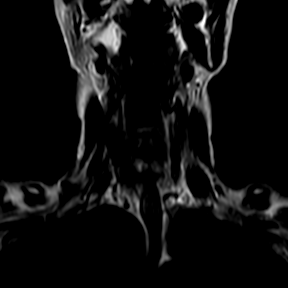
[im 3/11]
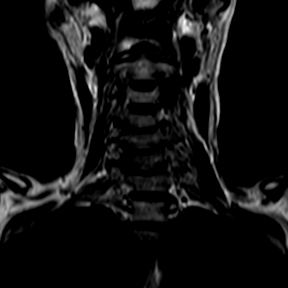
[im 5/11]
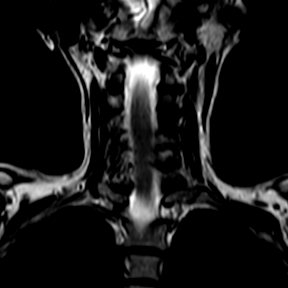
[im 7/11]
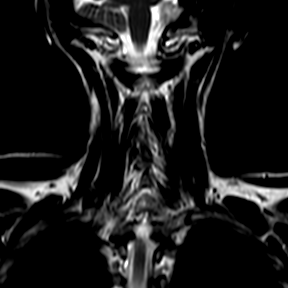
[im 9/11]
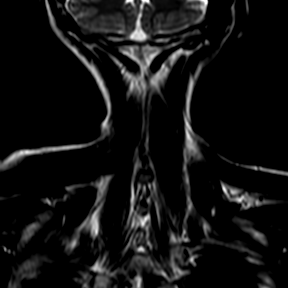
[im 11/11]
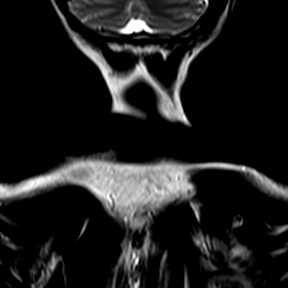

[Series 301: t1_sag · sagittal · 3.0mm · 0.34mm/px · 6 of 15 slices shown]
[im 1/15]
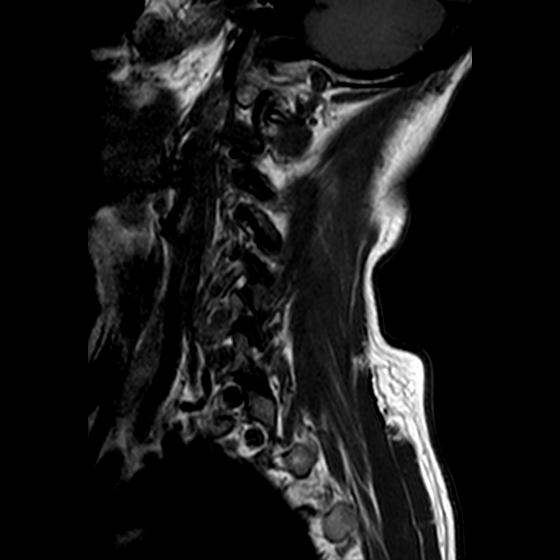
[im 3/15]
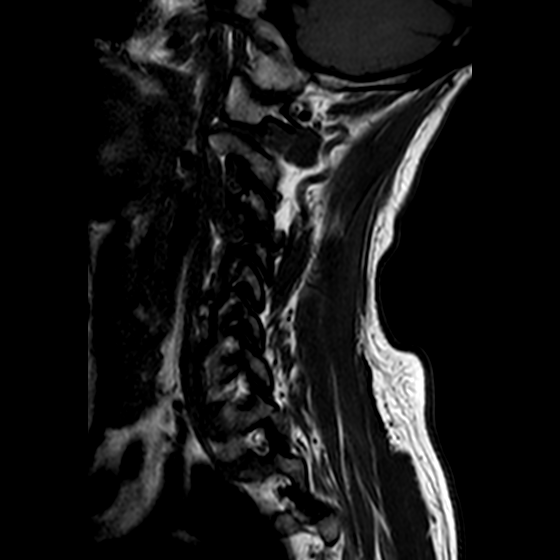
[im 6/15]
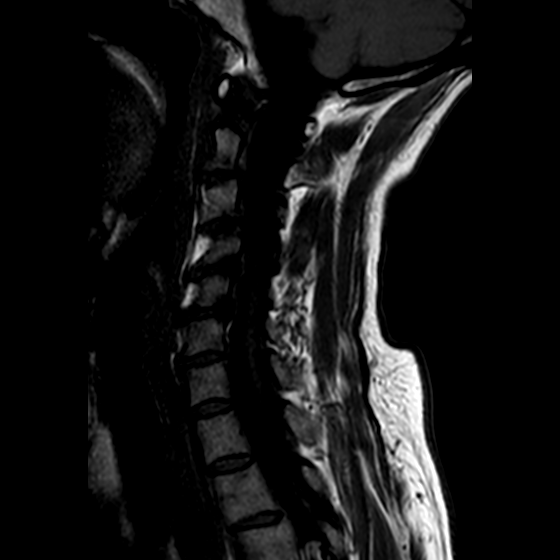
[im 9/15]
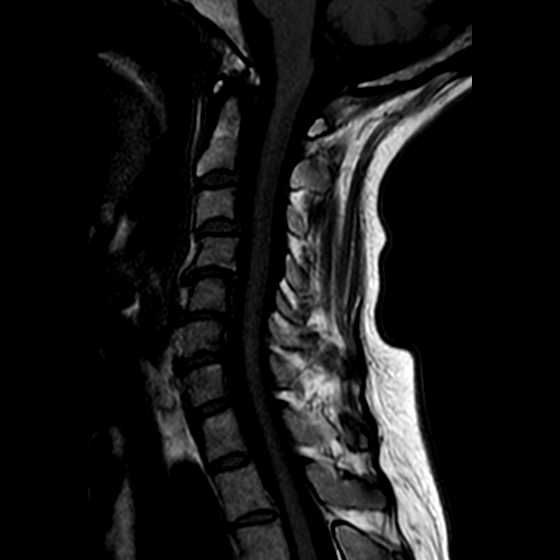
[im 12/15]
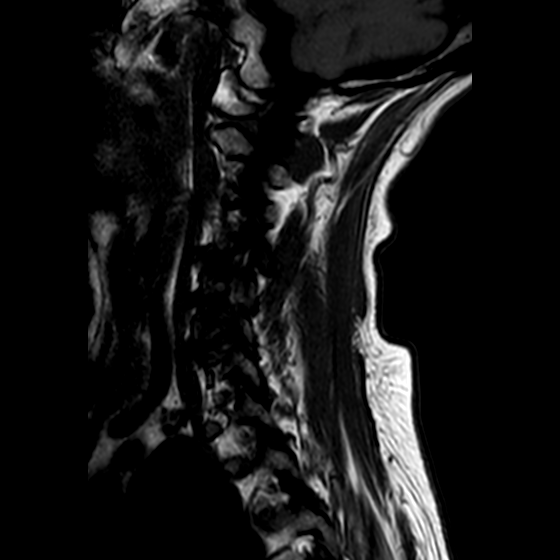
[im 15/15]
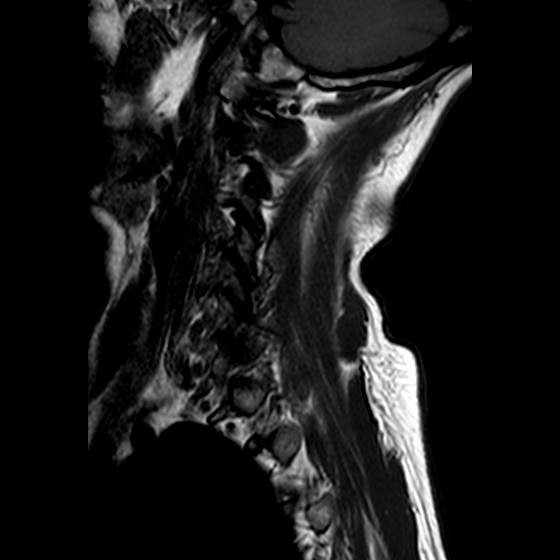

[Series 401: t2w_mv_xd_sag · sagittal · 3.0mm · 0.30mm/px · 5 of 15 slices shown]
[im 1/15]
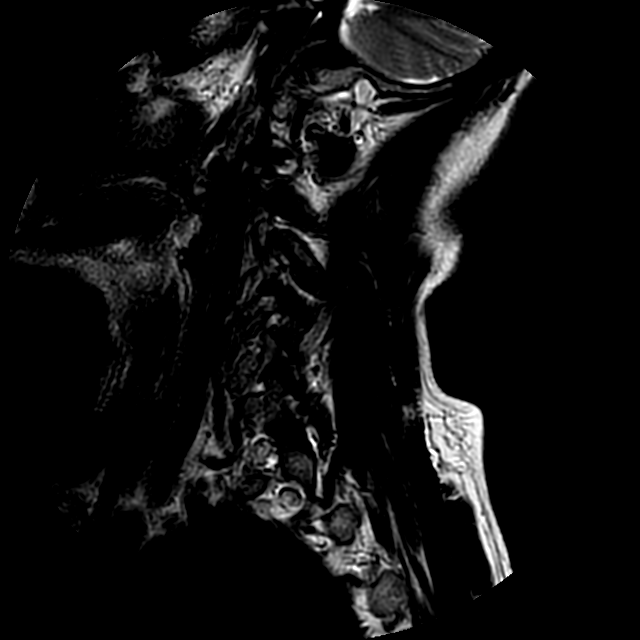
[im 3/15]
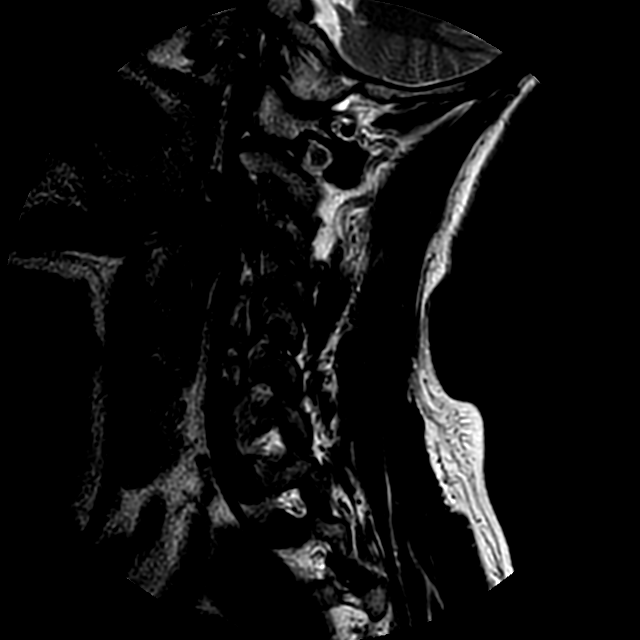
[im 6/15]
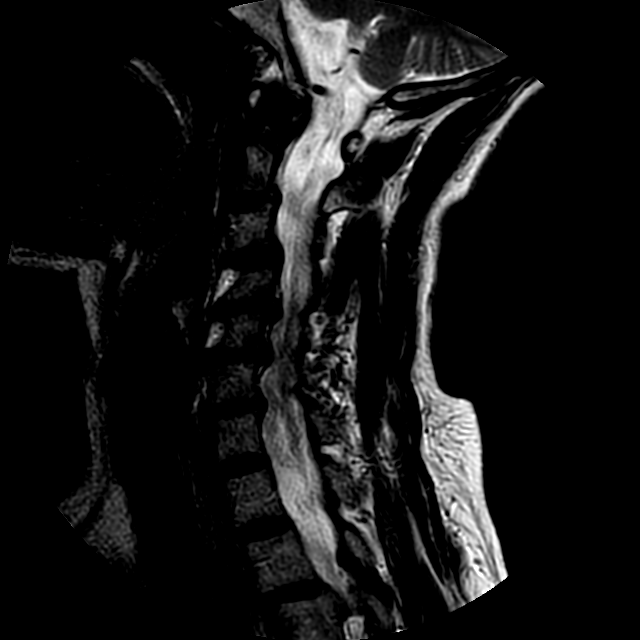
[im 9/15]
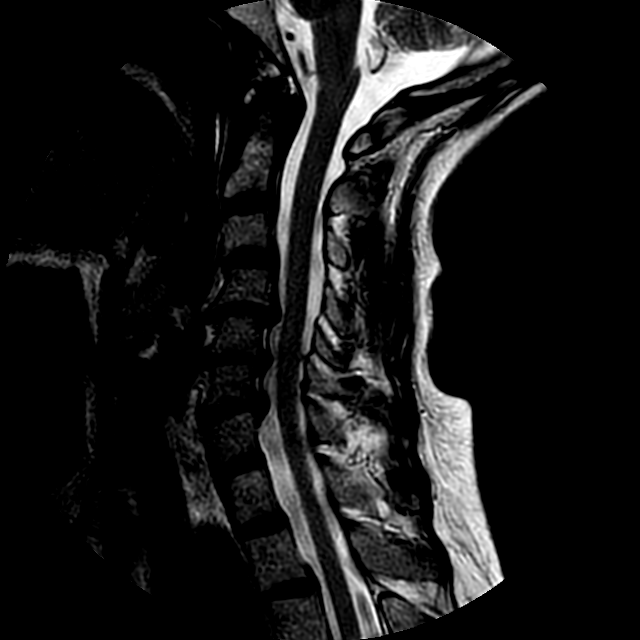
[im 15/15]
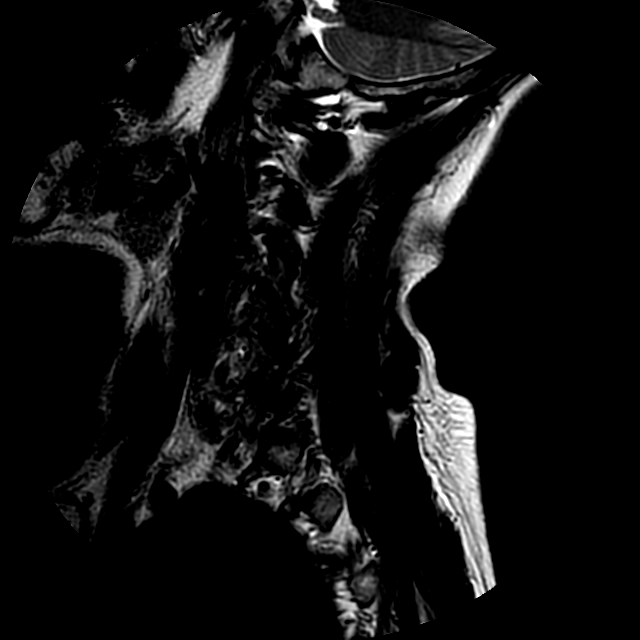

[24 of 48 positions shown; findings below may reference images not displayed]

FINDINGS: Slight anterolisthesis C3 on C4 is again seen and mild retrolisthesis 
C4 on C5. No vertebral body fracture. No marrow edema or stress response. Normal 
cord signal intensity. Cerebellar tonsils are well located. Posterior paraspinal 
musculature is symmetric. 
C2-C3: Moderate disc desiccation without disc height loss. Small annular tear. 
Mild uncovertebral and facet hypertrophy on the left with mild left neural 
foraminal stenosis. No right neural foraminal stenosis or spinal canal stenosis. 
C3-C4: Moderate disc desiccation without disc height loss. There is 
uncovertebral and facet hypertrophy greater on the left with severe left neural 
foraminal stenosis. There is potential for impingement of the left C4 nerve 
root. No right neural foraminal stenosis or spinal canal stenosis. 
C4-C5: Moderate disc desiccation and disc height loss. Moderate dorsal disc 
osteophyte complex. There is uncovertebral and facet hypertrophy bilaterally 
with severe bilateral neural foraminal stenosis. There is potential for 
impingement of the bilateral C5 nerve roots. 
C5-C6: Moderate disc desiccation and dorsal disc height loss. Mild dorsal disc 
osteophyte ridging. Mild uncovertebral and facet hypertrophy with moderate to 
severe right and moderate left neural foraminal stenosis. There is potential for 
impingement of the right greater than left C6 nerve roots. No spinal canal 
stenosis. 
C6-C7: Moderate disc desiccation and disc height loss. There is uncovertebral 
and facet hypertrophy greater on the right with severe right neural foraminal 
stenosis. No left neural foraminal stenosis or spinal canal stenosis. 
C7-T1: Moderate disc desiccation without disc height loss. No disc herniation. 
No spinal canal or neural foraminal stenosis.
IMPRESSION: 1.  Advanced spondylotic changes cervical spine. 
2.  Multilevel advanced neural foraminal stenoses with potential for multilevel 
neural impingement.

## 2020-03-21 IMAGING — MR MRI LUMBAR SPINE WITHOUT CONTRAST
4 of 7 series · 20 of 48 positions shown · IV contrast (gadolinium)
Comparison: Lumbar CT January 21, 2020

MRI LUMBAR SPINE WITHOUT CONTRAST, 03/21/2020 [DATE]: 
CLINICAL INDICATION: Low back pain
TECHNIQUE: Sagittal T1, Sagittal T2, Sagittal STIR, Axial T1 and Axial T2 MR 
images of the lumbar spine were performed without intravenous gadolinium 
enhancement.

[Series 101: survey · axial · 10.0mm · 1.39mm/px · z∈[-33,+201]mm · 3 of 10 slices shown]
[im 1/10]
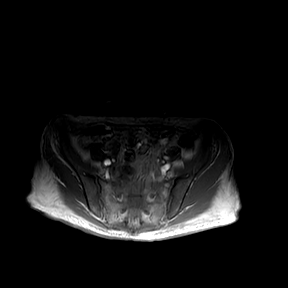
[im 5/10]
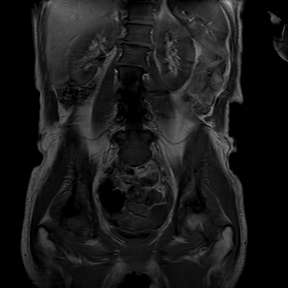
[im 10/10]
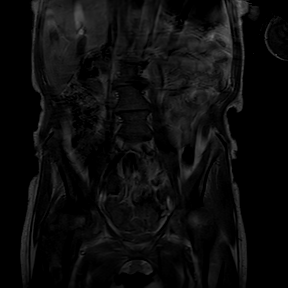

[Series 201: t2w_cor-surv · coronal · 6.0mm · 0.60mm/px · 3 of 9 slices shown]
[im 1/9]
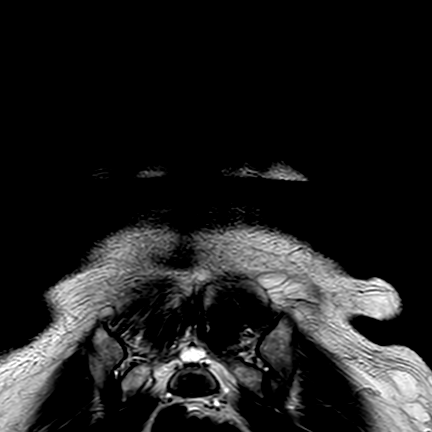
[im 5/9]
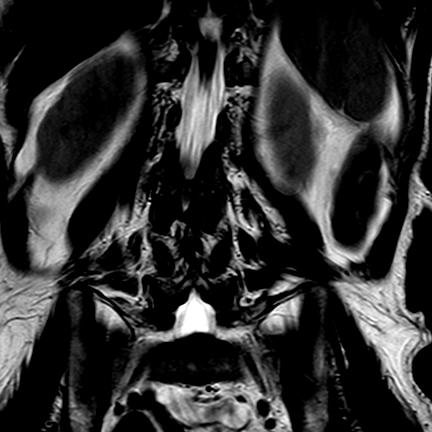
[im 9/9]
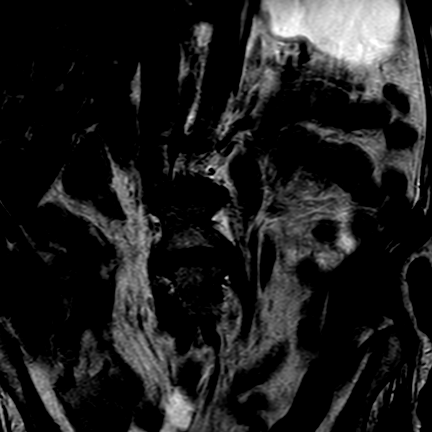

[Series 301: t2_tse_sag · sagittal · 4.0mm · 0.43mm/px · 5 of 17 slices shown]
[im 1/17]
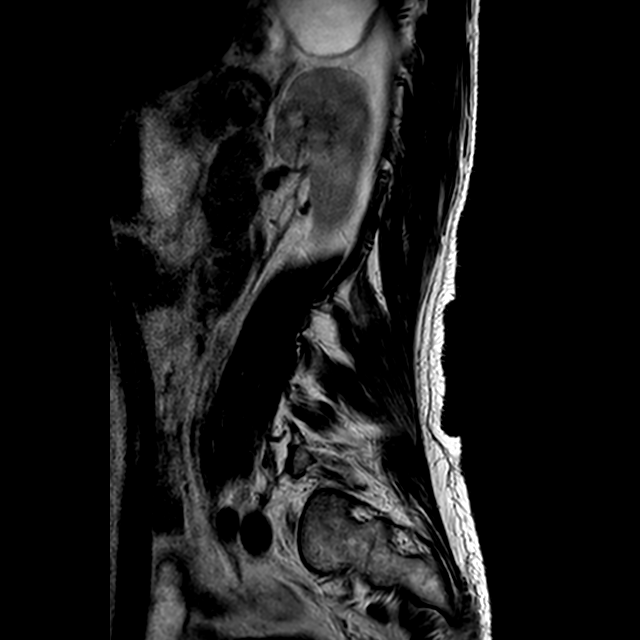
[im 4/17]
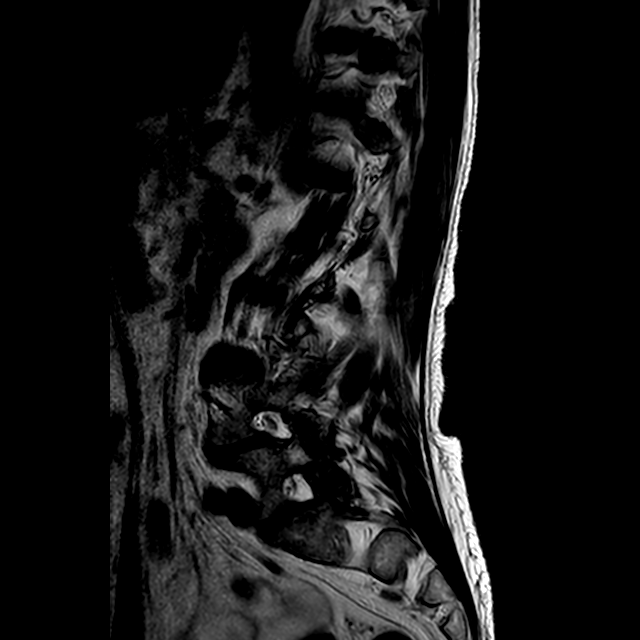
[im 7/17]
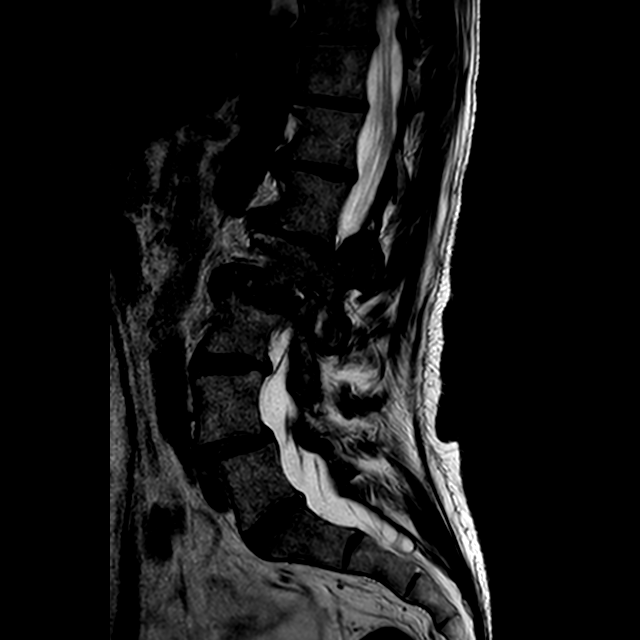
[im 10/17]
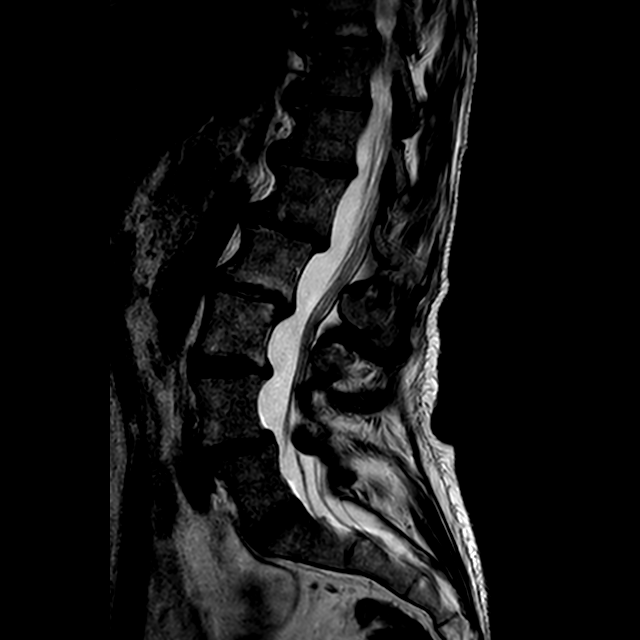
[im 17/17]
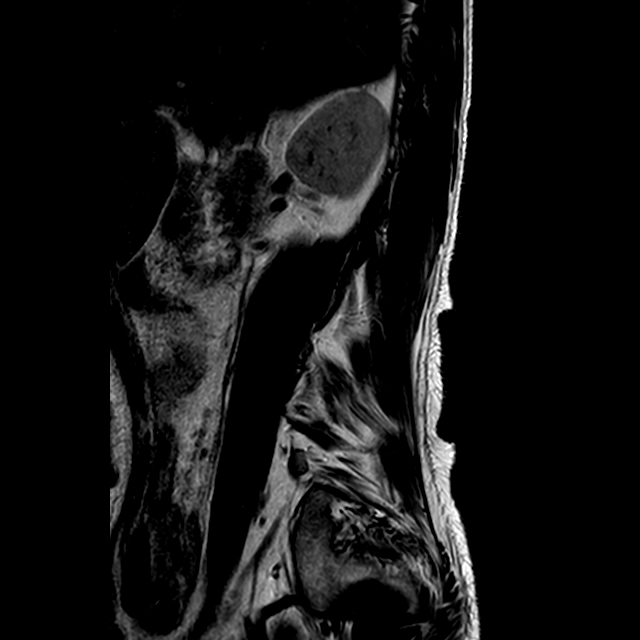

[Series 701: T1 · oblique · 4.0mm · 0.38mm/px · 9 of 35 slices shown]
[im 1/35]
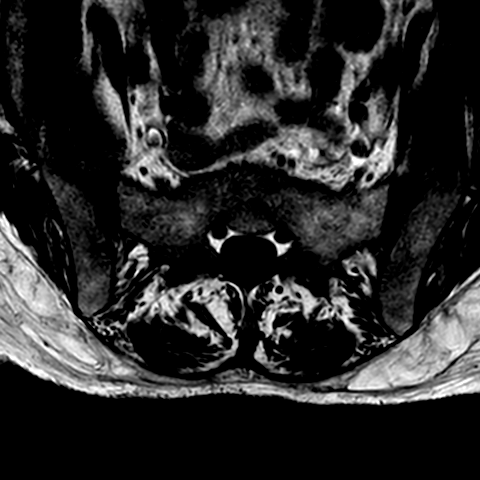
[im 7/35]
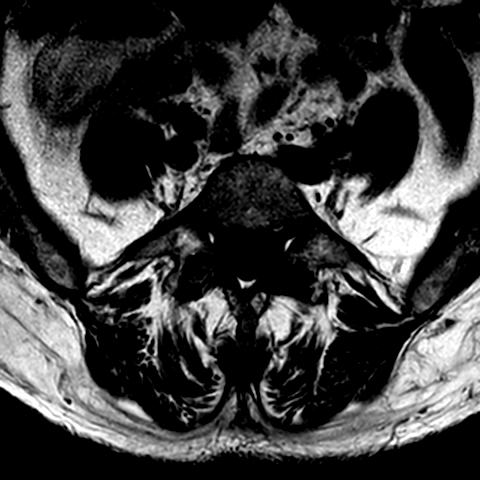
[im 10/35]
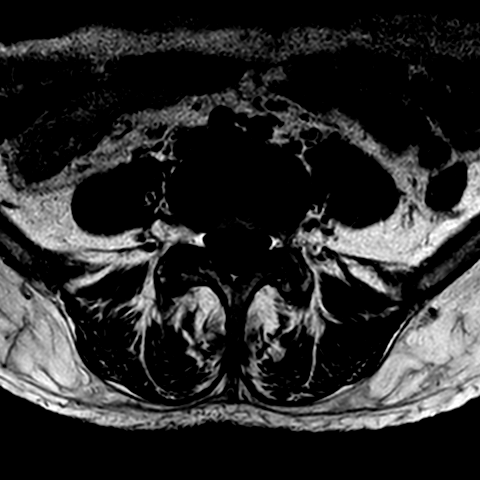
[im 16/35]
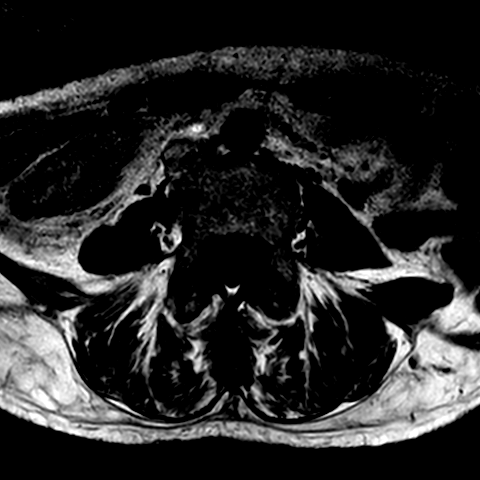
[im 19/35]
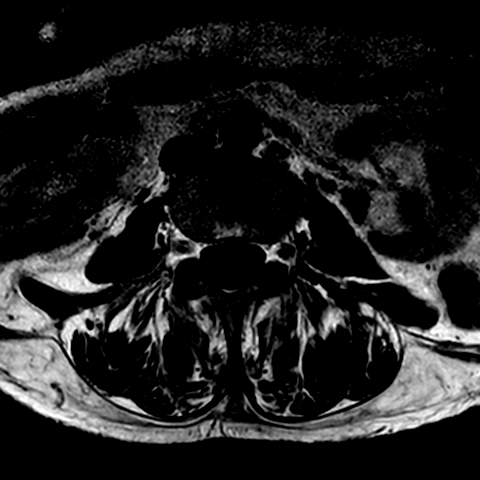
[im 25/35]
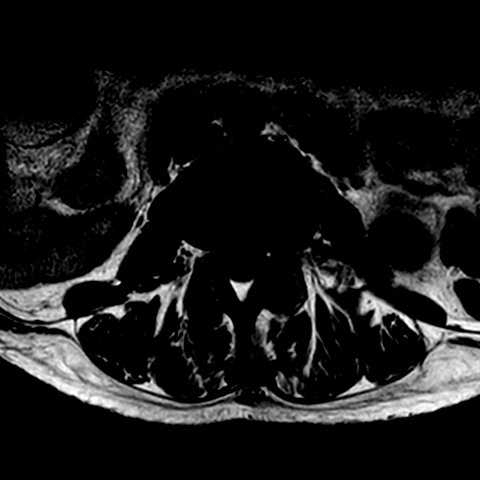
[im 28/35]
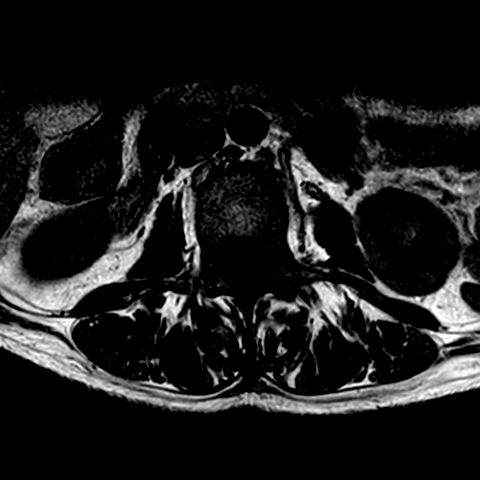
[im 31/35]
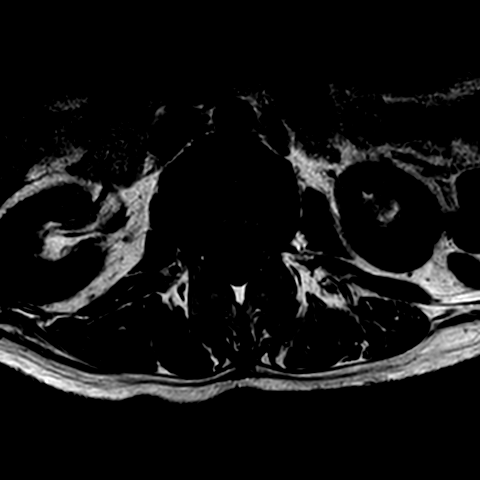
[im 35/35]
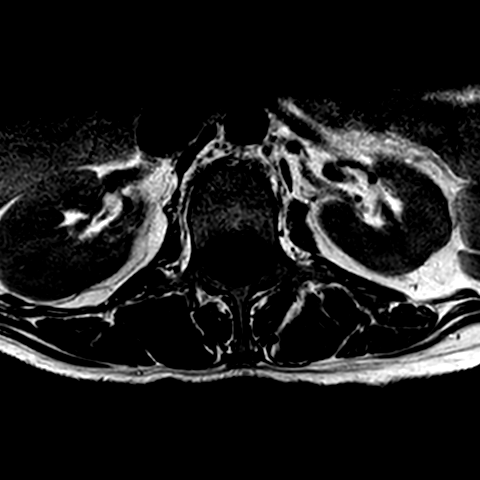

[20 of 48 positions shown; findings below may reference images not displayed]

FINDINGS: Lumbar vertebral heights are intact. There is less than grade 1 
anterolisthesis at L4-5, and less than grade 1 retrolisthesis at L1-2 and L2-3. 
There is moderate reactive endplate edema involving the left L2-3 endplates. 
There is no evidence for compression fracture or spinal malignancy. The conus is 
normal.  Visualized sacrum is intact. 
 Coronal images show mild to moderate upper lumbar dextroscoliosis. The 
localizer shows marked abnormality of the femoral heads bilaterally, worse on 
the right. Similar changes seen on the [DATE] abdominal/pelvic CT. 
At L5-S1 the canal and foramina are open. 
At L4-5 the canal and foramina are open. There is moderate marked facet 
degenerative change. 
At L3-4 there is a paracentral disc bulge, mildly effacing the right ventral 
thecal sac. There is mild encroachment on the exiting right L4 nerve root, axial 
T2 image 15. 
At L2-3 there is a larger left posterolateral disc bulge, likely with component 
of extrusion. This deforms the left ventral thecal sac and encroaches on the 
left L3 nerve root, axial T2 image 21. There is left foraminal stenosis at this 
level impinging the distal left L2 nerve root. 
At L1-2 there is broad-based disc bulge with mild canal stenosis. Foramina are 
open.
IMPRESSION: Multilevel degenerative changes associated with mild to moderate 
dextroscoliosis. There is a left posterolateral bulge/extrusion at L2-3 
deforming the left ventral thecal sac and encroaching on the exiting left L3 
nerve root. Left foraminal stenosis at this level impinges the distal left L2 
nerve root. There is reactive edema involving the left L2-3 endplates. 
Less than grade 1 retrolisthesis at L1-2 and L2-3. Less than grade 1 
anterolisthesis at L4-5. There is no pars defect. 
No evidence for compression fracture or spinal malignancy. 
Advanced degenerative changes of the femoral heads with flattening worse on the 
right. If indicated MRI of the right hip/pelvis would be useful.

## 2020-04-13 IMAGING — MR MRI RIGHT HIP WITHOUT CONTRAST
4 of 6 series · 24 of 40 positions shown · IV contrast (gadolinium)
Comparison: Radiographs the same day. PET/CT exam of 02/26/2020.

MRI RIGHT HIP WITHOUT CONTRAST, 04/13/2020 [DATE]: 
CLINICAL INDICATION: Right hip pain for 2 years. No known injury. History of 
osteoarthritis.
TECHNIQUE: Multiplanar, multiecho position MR images of the right hip were 
performed without intravenous gadolinium enhancement. 
Large field-of-view images were performed of the pelvis include the left hip for 
comparison.

[Series 101: survey_fullfov_transversal · axial · 10.0mm · 1.84mm/px · z∈[-51,+51]mm · 3 of 7 slices shown]
[im 1/7]
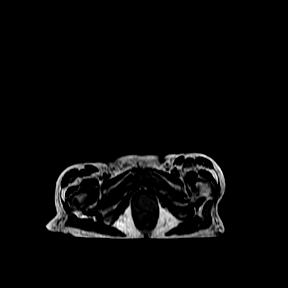
[im 4/7]
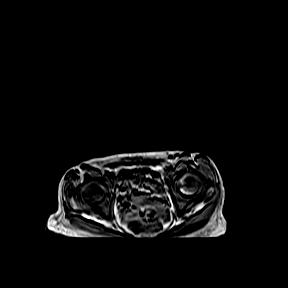
[im 7/7]
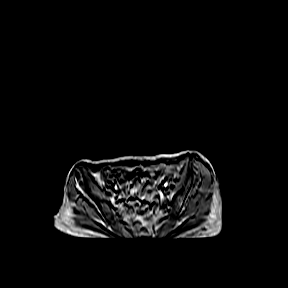

[Series 201: survey · axial · 10.0mm · 1.14mm/px · z∈[-20,+199]mm · 3 of 11 slices shown]
[im 1/11]
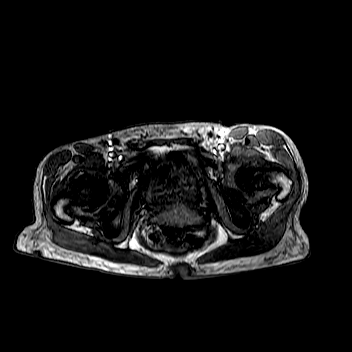
[im 6/11]
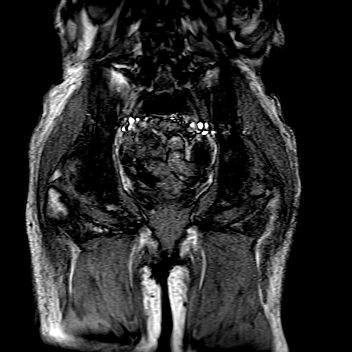
[im 11/11]
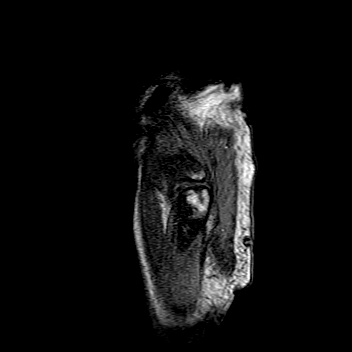

[Series 301: stir_cor-pelvis · coronal · 5.0mm · 0.71mm/px · 9 of 30 slices shown]
[im 1/30]
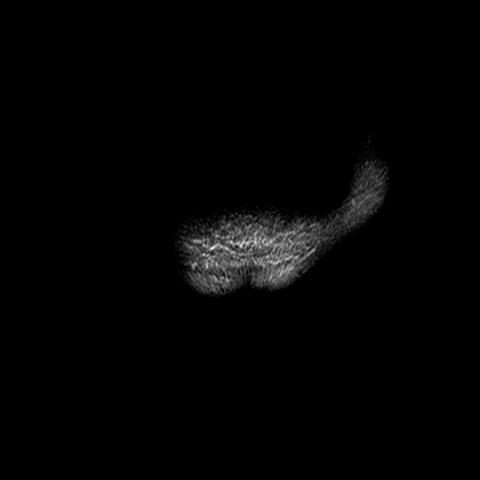
[im 4/30]
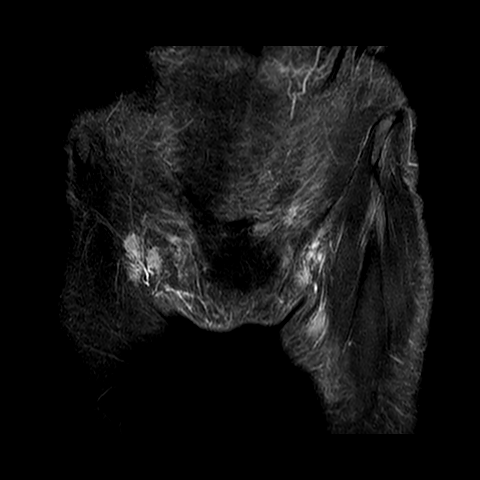
[im 8/30]
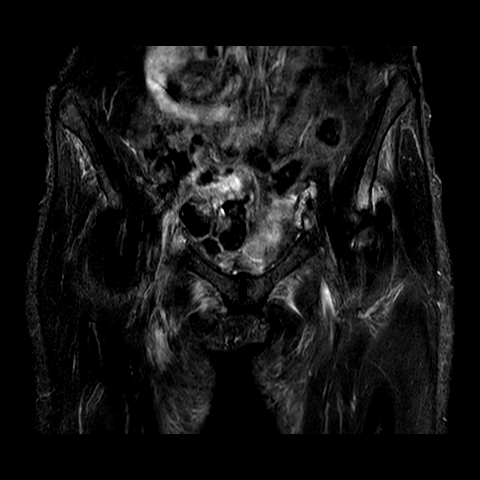
[im 11/30]
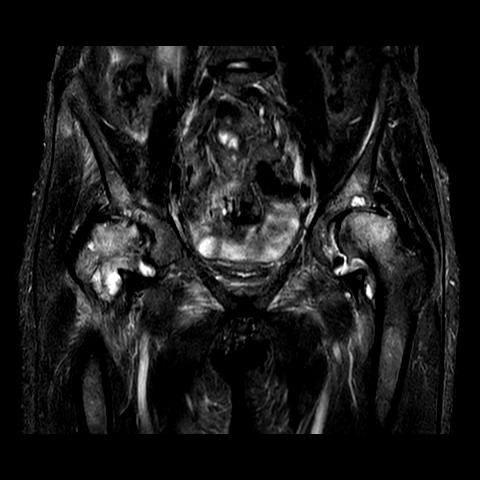
[im 15/30]
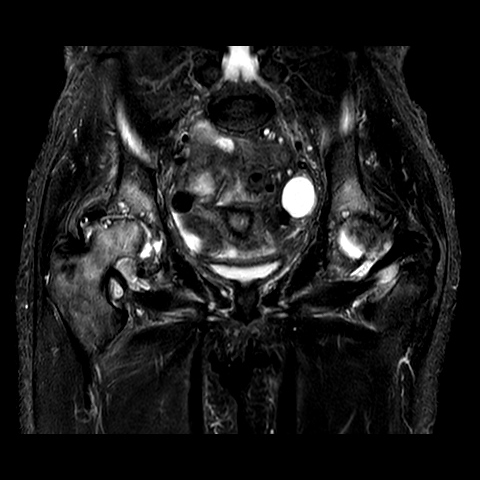
[im 19/30]
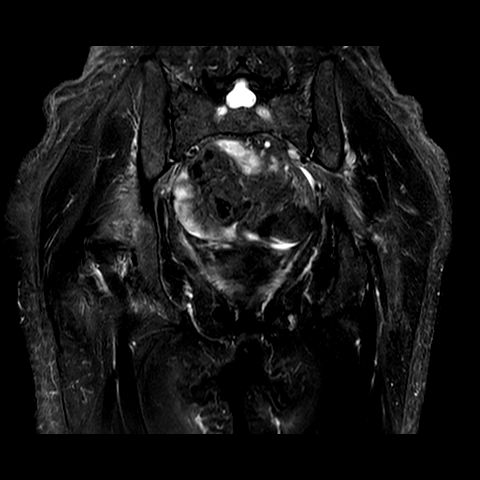
[im 22/30]
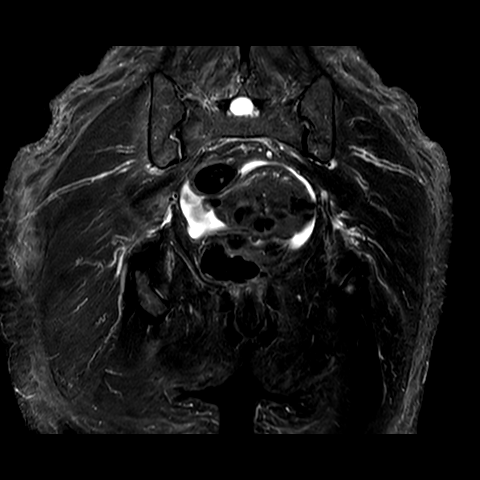
[im 26/30]
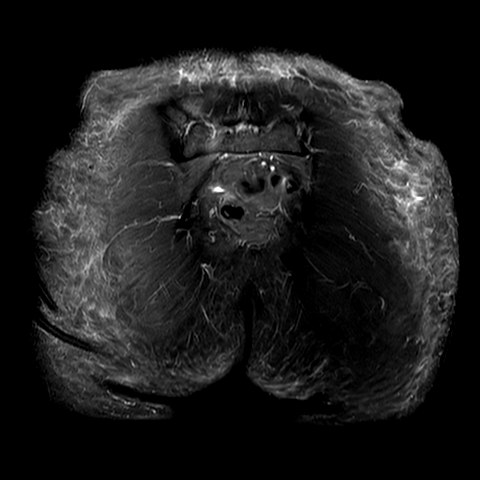
[im 30/30]
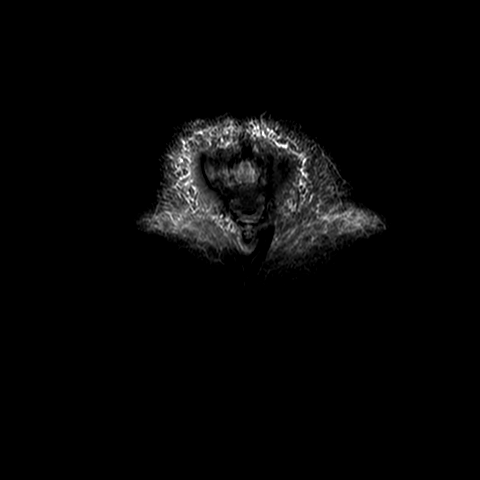

[Series 401: t1_(person_name) · axial · 6.0mm · 0.78mm/px · z∈[-113,+119]mm · 9 of 30 slices shown]
[im 1/30]
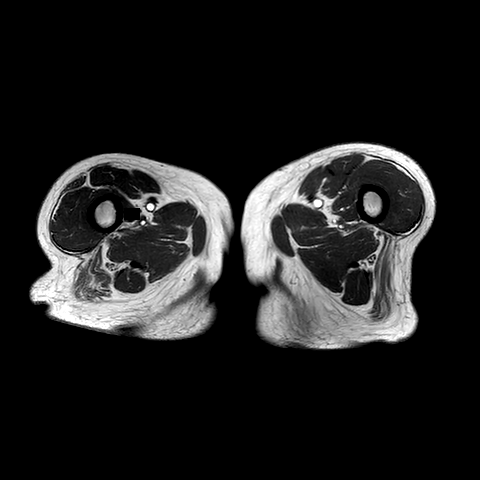
[im 4/30]
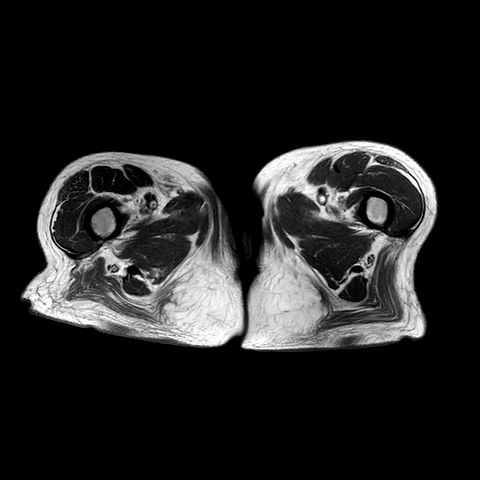
[im 8/30]
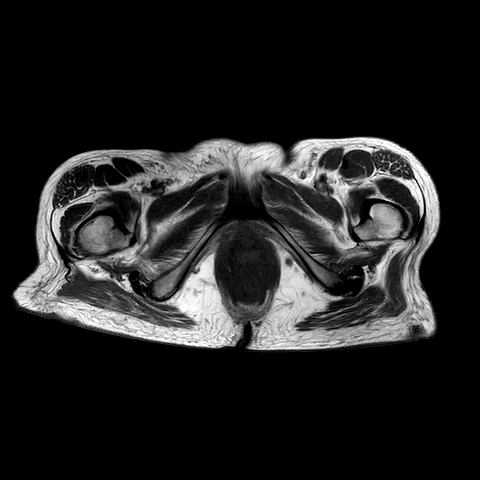
[im 11/30]
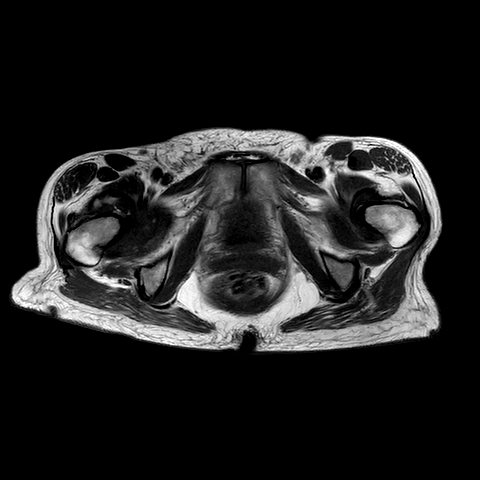
[im 15/30]
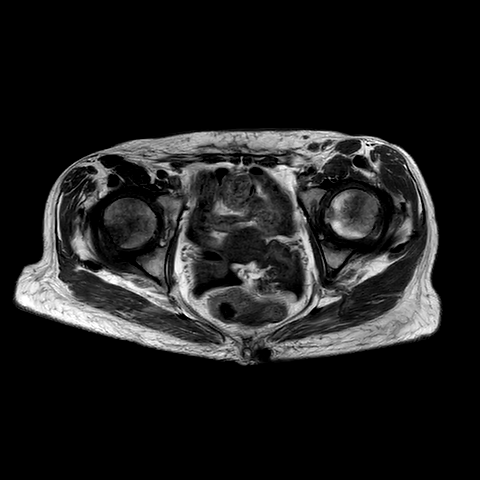
[im 19/30]
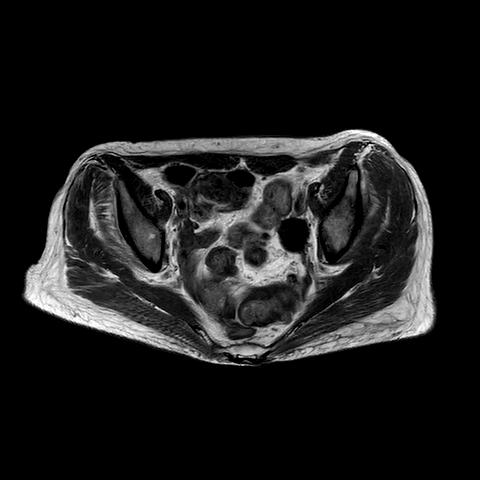
[im 22/30]
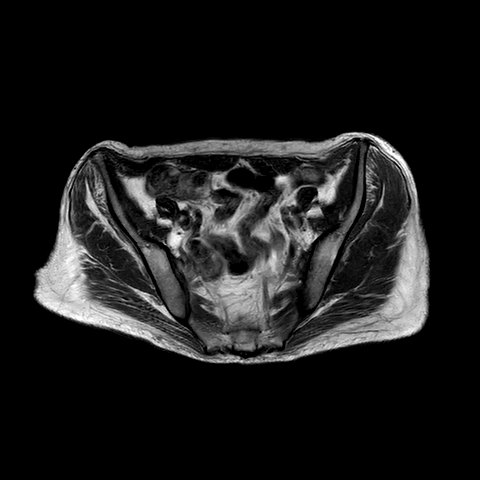
[im 26/30]
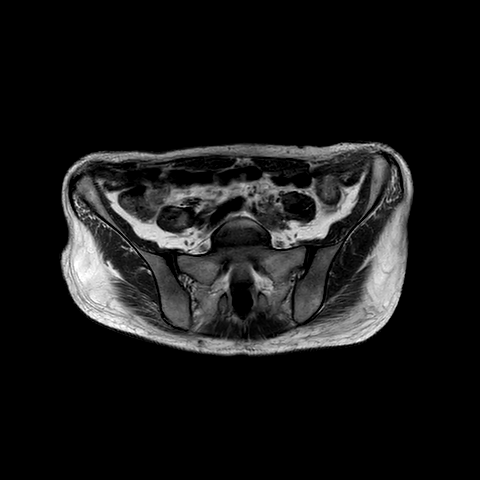
[im 30/30]
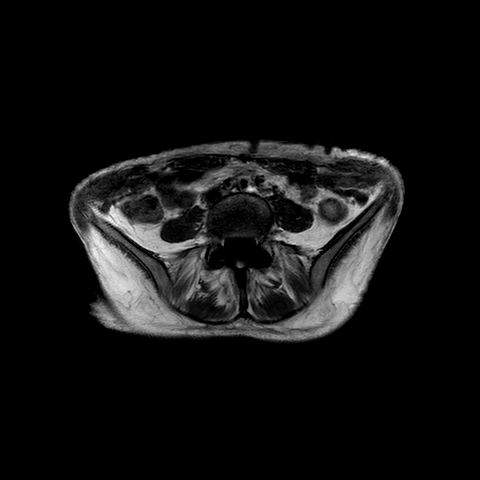

[24 of 40 positions shown; findings below may reference images not displayed]

FINDINGS: Bones and joints: Advanced articular cartilaginous loss both hips with complete 
articular cartilaginous loss bilaterally. There is osseous remodeling of the 
bilateral acetabulum and femoral head flattening of the femoral head articular 
surface. There is bilateral subcortical cystic change and edema with multifocal 
degenerative tearing of the bilateral labrum. Small bilateral hip joint 
effusions with synovitis. No avascular necrosis. No fracture. SI joints are 
preserved. Mild disc desiccation included lower lumbar spine. 
Soft Tissues: The bilateral abductor cuffs are preserved. The insertions of the 
iliopsoas tendons are intact. The origins of the hamstrings are preserved. The 
musculature is symmetric without strain or asymmetric fatty infiltration. No 
iliopsoas or trochanteric bursitis. Included neurovascular bundles are negative. 
Intrapelvic contents include a small amount of free fluid within the pelvis. 
There is a 2.7 cm left ovarian cyst, this increase in measured 3.3 cm on the PET 
CT exam. Mild scattered soft tissue edema about the hips.
IMPRESSION: 1.  Markedly advanced degenerative changes bilaterally with an osteoarthritic 
appearance. 
2.  2.7 cm left ovarian cyst.

## 2020-04-13 IMAGING — DX HIP 2 VIEWS RIGHT WITH PELVIS
3 series · 3 of 3 positions shown · non-contrast
Comparison: None

HIP 2 VIEWS RIGHT WITH PELVIS, 04/13/2020 [DATE]: 
CLINICAL INDICATION: Right hip pain

[AP (1 of 2)]
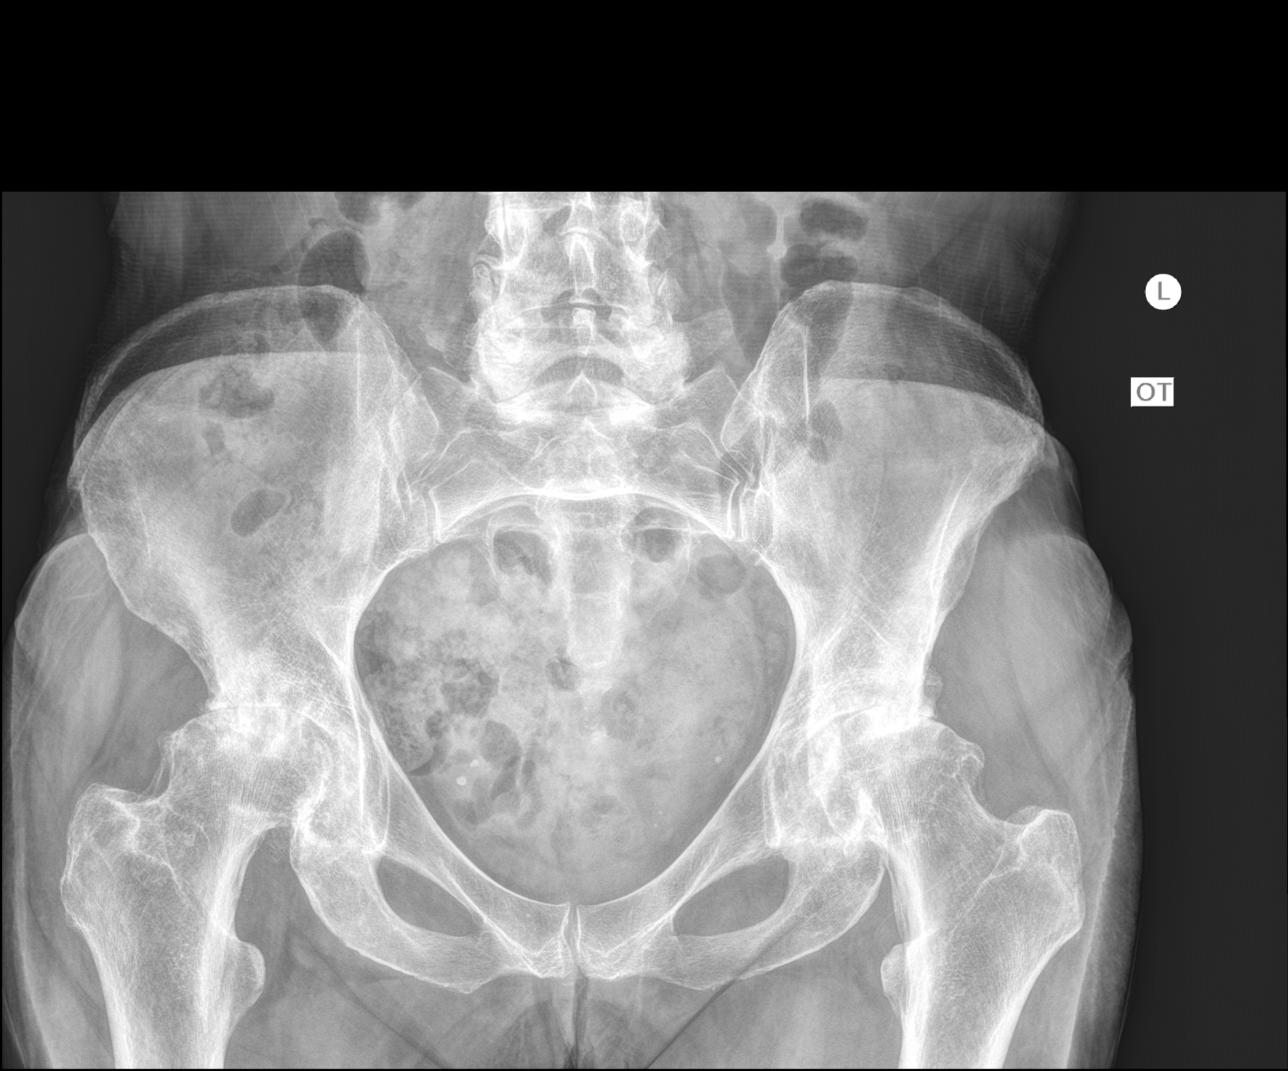

[AP (2 of 2)]
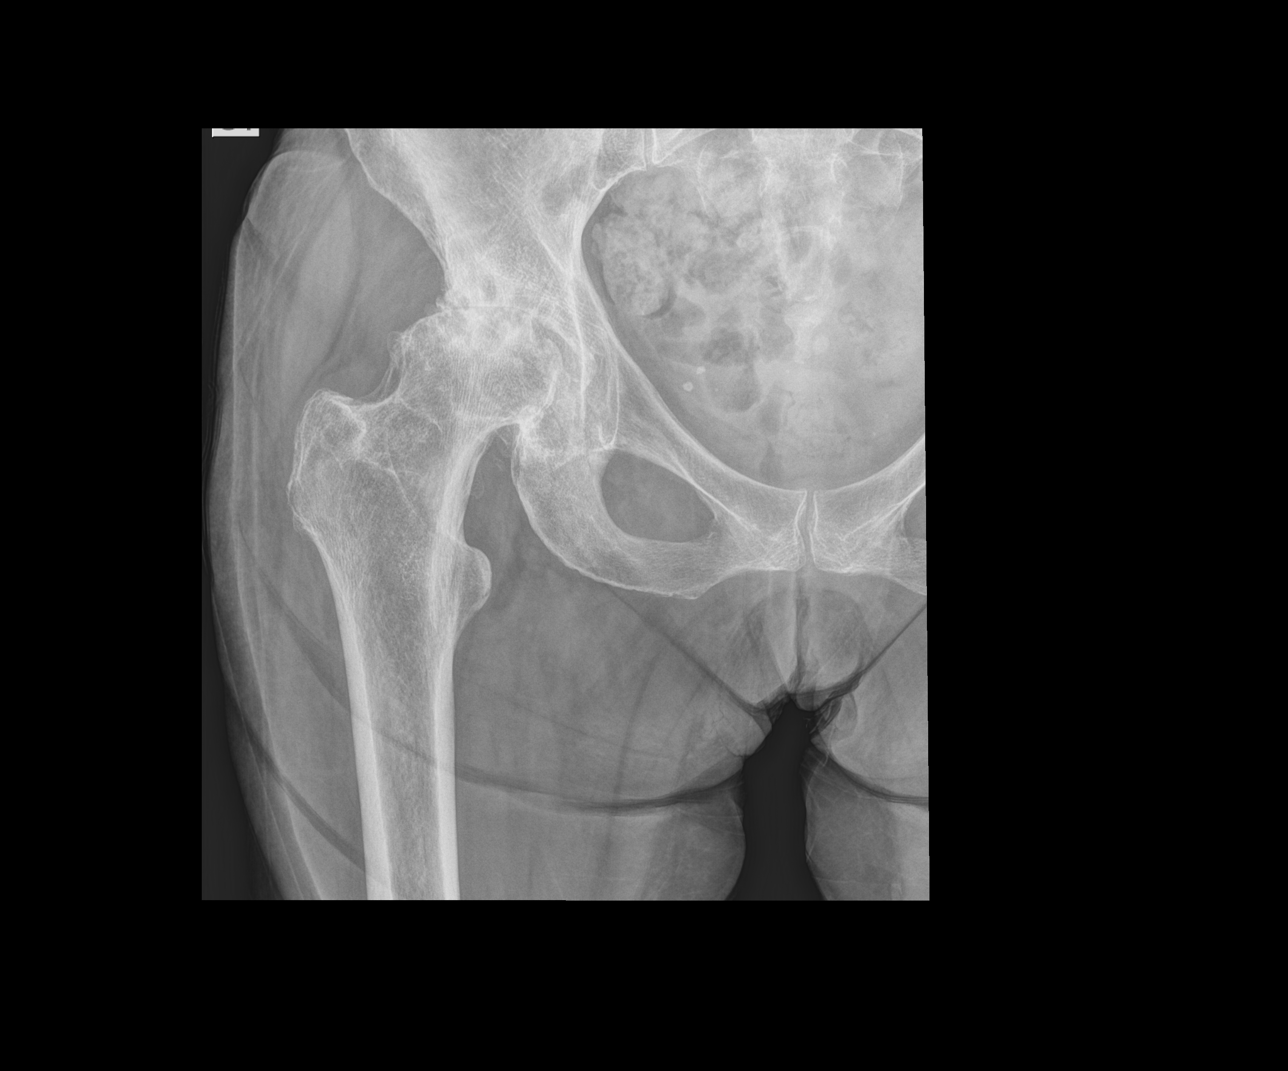

[lateral]
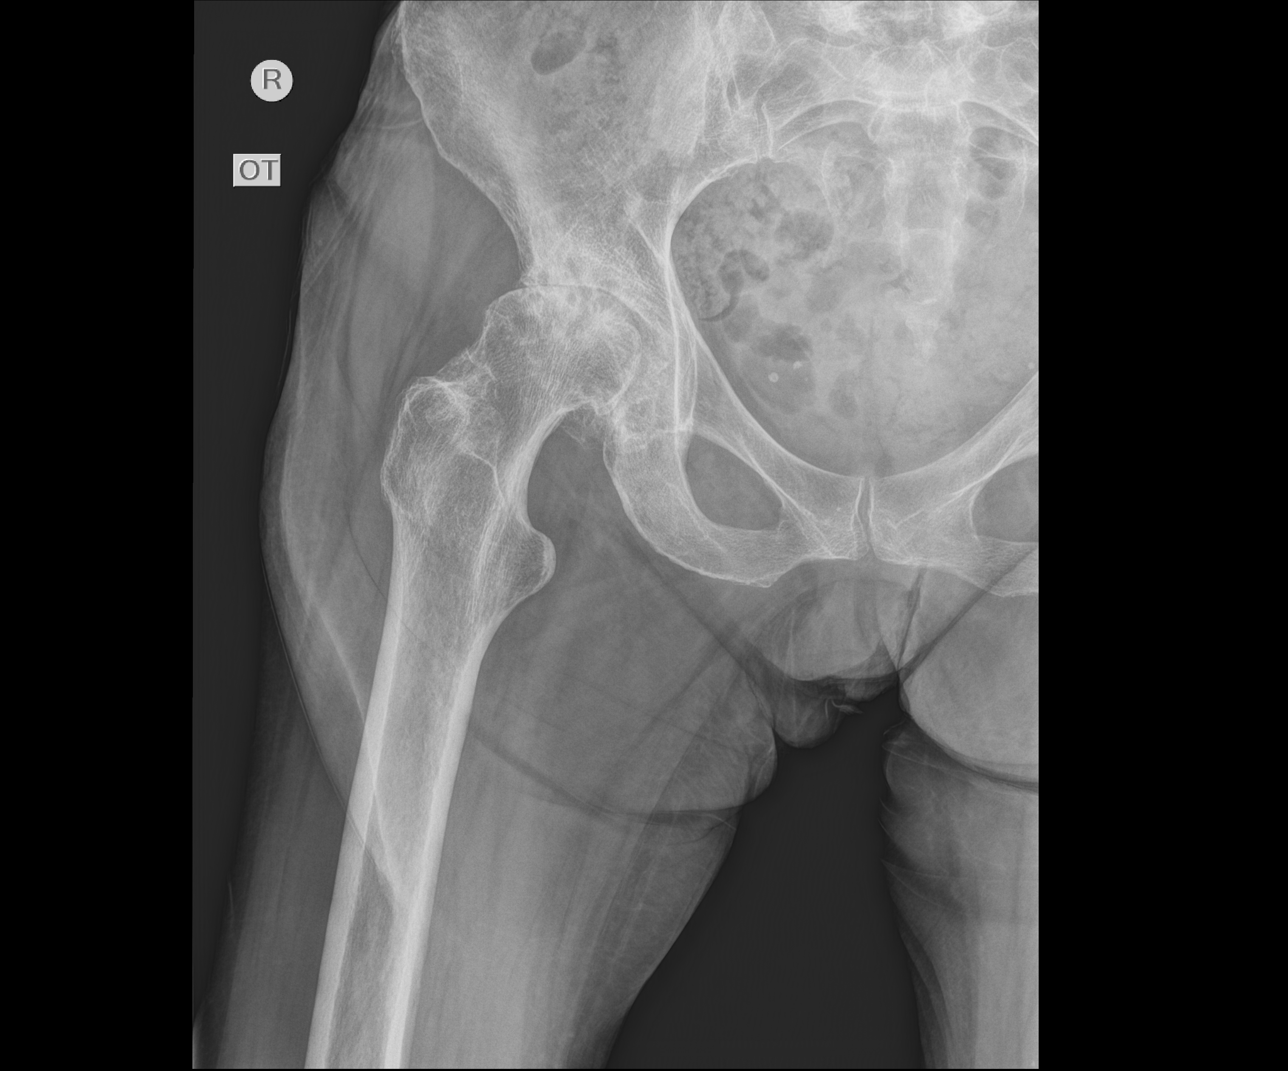

[3 of 3 positions shown; findings below may reference images not displayed]

FINDINGS: Marked joint space loss is apparent in both hips with underlying subchondral 
sclerosis and subchondral cyst formation apparent in the femoral heads and the 
overlying acetabula. The changes are generally severe, but are more prominent on 
the right than the left. Flattening and contour distortion in the femoral heads 
is exhibited bilaterally. Degenerative changes are noted in the lower lumbar 
spine.
IMPRESSION: Very advanced degenerative arthritis is exhibited in both hips, greater on the 
right than the left. 
There are degenerative changes in the lower lumbar spine

## 2022-06-15 ENCOUNTER — Encounter: Attending: Student in an Organized Health Care Education/Training Program

## 2022-06-15 NOTE — Telephone Encounter (Signed)
Pt arrived late for her NP appt; resched'd to 09/14/22 and placed on waiting list for sooner appt. Pt needs to be established to continue maintenance of existing health issues. Pt presented a list of meds she needs to be refilled. I informed pt that she would likely need to see provider before he will refill any prescriptions. Provided pt w/ (2) [facility] locations.     List of meds pt would like refilled:    - Fluticasone    - Aderol    - Albuterol    - Trazadone (to sleep)    - Amoxicillin - prescribed by UC for double ear infection; pt still having trouble/pain w/ one ear    - Prednisone     - Duoxotene     Pt appt on wait list

## 2022-06-22 ENCOUNTER — Ambulatory Visit
Admit: 2022-06-22 | Discharge: 2022-08-24 | Payer: MEDICARE | Attending: Student in an Organized Health Care Education/Training Program | Primary: Student in an Organized Health Care Education/Training Program

## 2022-06-22 DIAGNOSIS — Z7689 Persons encountering health services in other specified circumstances: Secondary | ICD-10-CM

## 2022-06-22 DIAGNOSIS — Z1231 Encounter for screening mammogram for malignant neoplasm of breast: Secondary | ICD-10-CM

## 2022-06-22 MED ORDER — FLUTICASONE-SALMETEROL 250-50 MCG/ACT IN AEPB
250-50 | Freq: Two times a day (BID) | RESPIRATORY_TRACT | 5 refills | Status: DC
Start: 2022-06-22 — End: 2022-09-26

## 2022-06-22 MED ORDER — PANTOPRAZOLE SODIUM 40 MG PO TBEC
40 | ORAL_TABLET | Freq: Every day | ORAL | 1 refills | Status: DC
Start: 2022-06-22 — End: 2022-11-29

## 2022-06-22 MED ORDER — TRAZODONE HCL 150 MG PO TABS
150 | ORAL_TABLET | Freq: Every evening | ORAL | 5 refills | Status: DC
Start: 2022-06-22 — End: 2022-12-28

## 2022-06-22 MED ORDER — ALBUTEROL SULFATE HFA 108 (90 BASE) MCG/ACT IN AERS
108 | Freq: Four times a day (QID) | RESPIRATORY_TRACT | 5 refills | Status: AC
Start: 2022-06-22 — End: ?

## 2022-06-22 NOTE — Patient Instructions (Addendum)
Sinus Congestion Therapy  1. Daily nasal sinus rinse and/or Neti-pot twice a day morning and at night.   2. Flonase twice a day 1-2 sprays in each nostril AFTER sinus rinse    Things to watch out for:  1. Fevers/Chills  2. Changes in drainage (more purulent - yellow/green)  3. Increased sinus pressure or pain  4. Confusion or changes in mentation  5. Shortness of breath or difficulty breathing  6. Cyanosis or turning blue at the lips/extremities        Learning About Lung Cancer Screening  What is screening for lung cancer?     Lung cancer screening is a way to find some lung cancers early, before a person has any symptoms of the cancer.  Lung cancer screening may help those who have the highest risk for lung cancer--people age 89 and older who are or were heavy smokers. For most people, who aren't at increased risk, screening for lung cancer probably isn't helpful.  Screening won't prevent cancer. And it may not find all lung cancers. Lung cancer screening may lower the risk of dying from lung cancer in a small number of people.  How is it done?  Lung cancer screening is done with a low-dose CT (computed tomography) scan. A CT scan uses X-rays, or radiation, to make detailed pictures of your body. Experts recommend that screening be done in medical centers that focus on finding and treating lung cancer.  Who is screening recommended for?  Lung cancer screening is recommended for people age 46 and older who are or were heavy smokers. That means people with a smoking history of at least 20 pack years. A pack year is a way to measure how heavy a smoker you are or were.  To figure out your pack years, multiply how many packs a day on average (assuming 20 cigarettes per pack) you have smoked by how many years you have smoked. For example:  If you smoked 1 pack a day for 20 years, that's 1 times 20. So you have a smoking history of 20 pack years.  If you smoked 2 packs a day for 10 years, that's 2 times 10. So you have a  smoking history of 20 pack years.  Experts agree that screening is for people who have a high risk of lung cancer. But experts don't agree on what high risk means. Some say people age 62 or older with at least a 20-pack-year smoking history are high risk. Others say it's people age 102 or older with a 30-pack-year history.  To see if you could benefit from screening, first find out if you are at high risk for lung cancer. Your doctor can help you decide your lung cancer risk.  What are the risks of screening?  CT screening for lung cancer isn't perfect. It can show an abnormal result when it turns out there wasn't any cancer. This is called a false-positive result. This means you may need more tests to make sure you don't have cancer. These tests can be harmful and cause a lot of worry.  These tests may include more CT scans and invasive testing like a lung biopsy. In a biopsy, the doctor takes a sample of tissue from inside your lung so it can be looked at under a microscope. A biopsy is the only way to tell if you have lung cancer. If the biopsy finds cancer, you and your doctor will have to decide how or whether to treat it.  Some lung cancers found on CT scans are harmless and would not have caused a problem if they had not been found through screening. But because doctors can't tell which ones will turn out to be harmless, most will be treated. This means that you may get treatment--including surgery, radiation, or chemotherapy--that you don't need.  There is a risk of damage to cells or tissue from being exposed to radiation, including the small amounts used in CTs, X-rays, and other medical tests. Over time, exposure to radiation may cause cancer and other health problems. But in most cases, the risk of getting cancer from being exposed to small amounts of radiation is low. It's not a reason to avoid these tests for most people.  What are the benefits of screening?  Your scan may be normal (negative).  For  some people who are at higher risk, screening lowers the chance of dying of lung cancer. How much and how long you smoked helps to determine your risk level. Screening can find some cancers early, when treatment may be more likely to work.  What happens after screening?  The results of your CT scan will be sent to your doctor. Someone from your care team will explain the results of your scan and answer any questions you may have. If you need any follow-up, he or she will help you understand what to do next.  After a lung cancer screening, you can go back to your usual activities right away.  A lung cancer screening test can't tell if you have lung cancer. If your results are positive, your doctor can't tell whether an abnormal finding is a harmless nodule, cancer, or something else without doing more tests.  What can you do to help prevent lung cancer?  Some lung cancers can't be prevented. But if you smoke, quitting smoking is the best step you can take to prevent lung cancer. If you want to quit, your doctor can recommend medicines or other ways to help.  Follow-up care is a key part of your treatment and safety. Be sure to make and go to all appointments, and call your doctor if you are having problems. It's also a good idea to know your test results and keep a list of the medicines you take.  Where can you learn more?  Go to https://www.bennett.info/ and enter Q940 to learn more about "Learning About Lung Cancer Screening."  Current as of: July 13, 2021               Content Version: 13.9   2006-2023 Healthwise, Incorporated.   Care instructions adapted under license by [facility]. If you have questions about a medical condition or this instruction, always ask your healthcare professional. [place] any warranty or liability for your use of this information.        [place]  (Call 211 if need more resources.)    [employer] (formerly Physicist, medical)  What  they offer: SNAP is used like cash to buy eligible food items from authorized retailers.  Apply for benefits online: ButterJelly.co.za  Apply for benefits by phone: [phone] (M-F 8 am - 7 pm; Sat 9 am - 12 pm)    Feed More Emergency Food Hotline  What they offer: Assistance with finding a food pantry, soup kitchen, or resource near you.  Website:  https://feedmore.org/agency-network/agency-locator/ (search zip code)  Hunger Hotline Inquiry Form: https://feedmore.org/hunger-hotline-inquiry-form/  Hunger Hotline Phone Number:  [phone] x [phone] (M-F 8:00 am-4:00 pm)  Meals on [employer] on Wheels is a program that delivers meals to individuals who have no reliable means for maintaining a healthy diet.  Services are provided by area.     [employer] Service Area:   Cities of [place], [place], [place].  [place], [place], [place], [place], [place], [place], [place], [place], [place], and [employer]  Website:  https://feedmore.org/how-we-help/meals-on-wheels/  To Apply:  Ages 22-59:  Apply online: https://feedmore.org/meals-on-wheels-application-form/  Apply by phone: [phone]        Meals on [place] (continued):  To Apply:  Ages 60+:  Apply Online: https://feedmore.org/meals-on-wheels-application-form/  Apply By Phone: [phone] (M-F 830 am- 5 pm)  For [place] of [place], [place] of [place], [place], [place], [place], [place], [place]: You may also apply by calling [employer] on Aging at [phone].  For Cities of [place], [place], and [place] as well as [place] of [place], [place], [place], [place], and [place]:    Probation officer on Aging: [place]:   [place], [place], [place], [place], [place], [place], [place], [place], [place] and [place].  Website:  CompPlans.co.za  To Apply by phone: [phone]    [place] ([facility]) area:   [place] of [place], [place], [place], [place], [place], [place], and [place].   Website: http://www.huynh.org/.html  For More Information: Call (561)300-9145 or email meals@psraaa .org    Meals on [place]:   Website: CardRetirement.cz  To Apply Online: SharperDiscounts.dk  To Apply by phone: [phone]    Meals on [facility]:  [place]:    [address], [place] [address]  [place]; Monday-Thursdays 10 am- 1 pm     [place], [place] [address]    [phone] Tuesday and Thursday 10 am -12:00 pm  [place], [place], [place]; 8-11:30 [place] [address]   [phone]; 1st and 3rd Tuesdays 9-11 am, 3rd Saturdays 9-11 am    [employer] - [address], [place] [address]    [phone]; 2nd, 3rd, and 4th Thursdays 10 am - 12 pm    [facility] [place] [address] [place], [place]   [phone]; Mondays 10 am-2 pm & Wednesdays 12:45 pm - 230 pm    [place], [place] [address]   [phone]; Thursdays 1- 4 pm    [place], [place] [address]  [phone]; 3rd Wednesdays of the month 1-3 pm    [place]   2nd and 4th Monday, distribution starts at 5 pm, [facility]   [address] [place], [place] [address]  1st and 3rd Fridays, distribution starts at 4:30 pm  [place], [place] [address]      [place] [facility] [place] (continued)   Saturdays, distribution  starts at 10 am  [address] [place] [address]  2nd - [employer], [place]. [address] [place] [address]  3rd - [facility], [place] [address] [place], [place]  [place], [address] [place] [address]    [place] [facility] - [address], [place] [address]  [phone]; Thursdays 530 pm -730 pm & Fridays 1130 am - 130 pm    [place]  [address]   [place] [address]   [phone]; Monday and Tuesday, 2 pm, 3rd Wednesday 12 pm, 4th Friday 9 am    [place] [address]  [phone]; Saturday 8-10:30 [place] [address]   [phone]; Tuesday 9 am - 12 pm & 3-5 pm, Friday 3-5 pm, Saturday 9 am - 12 pm      [employer]*  (Call 211 if need more resources.)    [place]  What they offer: The [employer] Program helps uninsured patients who do not qualify for government-sponsored health insurance and cannot afford to pay for their medical care. Insured patients may also qualify for assistance based on family income, family size, and medical needs.  Phone Number: [phone]  How to apply for the [place] Program:  Option 1: To apply for financial assistance, a patient (or their family or other provider) should fill out the Patent examiner. Copies of the Financial Assistance Application and the FAP may be obtained for free by calling the [employer] customer service department at [phone].  Option 2: The Patent examiner and policy may be obtained for free by downloading a copy from the [employer]:  https://www.bonsecours.com/patient-resources/financial-assistance  Applications are available in several languages on the website    [place]  What they offer: [facility] has a Gaffer that provides free or discounted health care to qualified patients.  Website:  GunRadar.com.au  Phone Number for Patient Benefit Advisors: [phone]    [place]  What they offer: Help with understanding a bill, finding out what insurance pays, applying for financial aid, or setting up a payment plan.  Website:  ScrubPoker.cz  Financial Counseling Call Center: [place]  What they offer:   Mobile healthcare resources for uninsured patients.  Contact: [phone]        IVNA  What they offer:  Healthcare screenings and vaccines.  Contact: [phone]        Every Women's Life (EWL)  What they offer:  Mammograms and PAP smears regardless of ability to pay.  Contact: [phone]        [place]  What they offer: [facility] provides financial assistance to patients based on their income, assets, and needs. Assistance may also be provided in obtaining free or low-cost health insurance or arranging a manageable payment plan.  Website: InvestmentInstructor.fi  Assistance application can be downloaded from above website  [place]: [phone]          Medications    Good Rx  What they offer: Good Rx tracks prescription drug prices and provides free drug coupons for discounts on medications.  Website: https://www.goodrx.com/  NeedyMeds  What they offer: NeedyMeds offers free information on medications and healthcare cost savings programs including prescription assistance programs, coupons, and discount programs.  Website:  https://www.needymeds.org/  Helpline: (613)653-5461    RX Assist  What they offer: Information about free and low-cost medicine programs.  Website: MarketGadgets.hu    [employer] $4 Prescription Program  What they offer: Prescription Program includes up to a 30-day supply for $4 and a 90-day supply for $10 of some covered generic drugs at commonly prescribed dosages  Website: SolarInventors.es Utilities  CommonHelp  What they offer: Partnership with the [place]. Assist with finding and applying for government funded programs and benefits. You can also update your benefits or report changes through CommonHelp.  Website: ButterJelly.co.za  Phone Number: N8865744 (423)462-0297)    [place]  What they offer: Beryle Lathe is [employer]'s energy assistance program of last resort for anyone who faces financial hardships from unemployment or family crisis.  Phone Number: [phone]  Address: [address], [place] [address]  Website: https://www.dominionenergy.com/[place]/billing/billing-options/energyshare    Energy Assistance Programs (EAP) - Department of Social Services  What they offer: EAP assists low-income households in meeting their immediate home energy needs.      Website: ProgramInsider.com.pt  Available assistance:   Crisis Assistance - Heating Emergencies  Fuel Assistance - Offset Heating Fuel Costs  Cooling Assistance - Applies to Cooling Utility Bills and Equipment  How to apply:  Online:  MarathonShow.se  Call:  Q000111Q   Paper application:   Print application from ProgramInsider.com.pt and submit to your local Department of [place] contacts: TheyParty.dk.[place], [place] Utility Affordability  Programs  https://www.rubio.com/  Call:  [phone]   Email:  dpucustserv@rva .gov  Programs available:  Equal Monthly Payment Plan, MetroCare [employer] Communications Program, [employer] Omaha Program, Artist, Engineer, civil (consulting), Solicitor, [place], [place] ([place])    [place])  [place] residents pay their water and [employer] online:  ConsumerMenu.fi  Questions:  Call Promise at [phone] (M-S 7am-7pm) or email: support@virginialihwap .com

## 2022-06-22 NOTE — Progress Notes (Signed)
Identified pt with two pt identifiers(name and DOB). Reviewed record in preparation for visit and have obtained necessary documentation. All patient medications has been reviewed.  Chief Complaint   Patient presents with    New Patient     Patient stated she is still having trouble breathing and ear infection             Health Maintenance Due   Topic    COVID-19 Vaccine (1)    Depression Screen     HIV screen     Hepatitis C screen     DTaP/Tdap/Td vaccine (1 - Tdap)    Cervical cancer screen     Lipids     Colorectal Cancer Screen     Breast cancer screen     Shingles vaccine (1 of 2)    DEXA (modify frequency per FRAX score)     Respiratory Syncytial Virus (RSV) Pregnant or age 18 yrs+ (1 - 1-dose 60+ series)    Pneumococcal 65+ years Vaccine (1 - PCV)    Flu vaccine (1)     Health Maintenance Review: Patient reminded of "due or due soon" health maintenance. I have asked the patient to contact his/her primary care provider (PCP) for follow-up on his/her health maintenance.        Wt Readings from Last 3 Encounters:   No data found for Wt     Temp Readings from Last 3 Encounters:   No data found for Temp     BP Readings from Last 3 Encounters:   No data found for BP     Pulse Readings from Last 3 Encounters:   No data found for Pulse       1. "Have you been to the ER, urgent care clinic since your last visit?  Hospitalized since your last visit?"  Yes    2. "Have you seen or consulted any other health care providers outside of the [facility] since your last visit?" Yes urgent care    3. For patients aged 49-75: Has the patient had a colonoscopy / FIT/ Cologuard? No      If the patient is female:    4. For patients aged 66-74: Has the patient had a mammogram within the past 2 years? No      5. For patients aged 21-65: Has the patient had a pap smear? No

## 2022-06-22 NOTE — Progress Notes (Signed)
Linda Hull (DOB:  03/28/1956) is a 67 y.o. female, New patient, here for evaluation of the following chief complaint(s):  New Patient (Patient stated she is still having trouble breathing and ear infection)        Subjective   SUBJECTIVE/OBJECTIVE:  Patient presents today to establish care with me    Patient does have prior PCP  Patient's prior PCP is Terri Skains, MD  Patient moved from [place]  Was there for 1.5 years - saw doctor there - [facility]    Acute concerns:   Coughing/lungs: patient has history of asthma and has needed inhaler more frequently after becoming sick. She was on a controller medication for asthma in the past, but has not had it for a while.  Allergies/congestion: was seen in urgent care and recommended flonase    Chronic problems:  Mild persistent asthma - see above  ADHD: States she is on Adderall 20 mg daily  Bipolar II: has not seen psychiatrist in 11-12 years. States this is well controlled. I did note she is on Cymbalta 60 mg, but no medications for bipolar  GERD with history of duodenal/gastric ulcers: Patient is on protonix 40 mg daily  Leg swelling: She has lasix that she uses as needed    Preventative care:  Health Maintenance Due   Topic Date Due    Pneumococcal 65+ years Vaccine (1 - PCV) Never done    HIV screen  Never done    Hepatitis C screen  Never done    DTaP/Tdap/Td vaccine (1 - Tdap) Never done    Cervical cancer screen  Never done    Lipids  Never done    Colorectal Cancer Screen  Never done    Breast cancer screen  Never done    Shingles vaccine (1 of 2) Never done    Low dose CT lung screening &/or counseling  Never done    DEXA (modify frequency per FRAX score)  Never done    Respiratory Syncytial Virus (RSV) Pregnant or age 50 yrs+ (1 - 1-dose 60+ series) Never done          Past Medical History:   Diagnosis Date    Duodenal ulcer 07/01/2021    Gastric ulcer 07/01/2021     History reviewed. No pertinent surgical history.   Family History   Problem Relation Age of  Onset    Lung Cancer Mother         non smoker    Hypertension Mother     Thalassemia Mother     High Blood Pressure Father     High Cholesterol Father     High Cholesterol Brother     Diabetes Brother     High Cholesterol Brother     Diabetes Brother      Social History     Socioeconomic History    Marital status: Single     Spouse name: Not on file    Number of children: Not on file    Years of education: Not on file    Highest education level: Not on file   Occupational History    Not on file   Tobacco Use    Smoking status: Every Day     Current packs/day: 0.50     Average packs/day: 0.5 packs/day for 41.0 years (20.5 ttl pk-yrs)     Types: Cigarettes     Start date: 06/22/1981    Smokeless tobacco: Never   Substance and Sexual Activity    Alcohol  use: Never    Drug use: Never    Sexual activity: Not on file   Other Topics Concern    Not on file   Social History Narrative    Not on file     Social Determinants of Health     Financial Resource Strain: High Risk (06/22/2022)    Overall Financial Resource Strain (CARDIA)     Difficulty of Paying Living Expenses: Very hard   Food Insecurity: Food Insecurity Present (06/22/2022)    Hunger Vital Sign     Worried About Running Out of Food in the Last Year: Often true     Ran Out of Food in the Last Year: Often true   Transportation Needs: Unknown (06/22/2022)    PRAPARE - Armed forces logistics/support/administrative officer (Medical): Not on file     Lack of Transportation (Non-Medical): No   Physical Activity: Not on file   Stress: Not on file   Social Connections: Not on file   Intimate Partner Violence: Not on file   Housing Stability: Unknown (06/22/2022)    Housing Stability Vital Sign     Unable to Pay for Housing in the Last Year: Not on file     Number of [place] in the Last Year: Not on file     Unstable Housing in the Last Year: No       Current Outpatient Medications:     furosemide (LASIX) 20 MG tablet, 1 tablet by NOT APPLICABLE route, Disp: , Rfl:     fluticasone  (FLONASE ALLERGY RELIEF) 50 MCG/ACT nasal spray, 1 spray by Nasal route, Disp: , Rfl:     magnesium chloride (MAG64) 64 MG TBEC extended release tablet, Take 1 tablet by mouth daily (with breakfast), Disp: , Rfl:     DULoxetine (CYMBALTA) 60 MG extended release capsule, Take 1 capsule by mouth, Disp: , Rfl:     amphetamine-dextroamphetamine (ADDERALL) 20 MG tablet, Take 1 tablet by mouth daily. Max Daily Amount: 20 mg, Disp: , Rfl:     fluticasone-salmeterol (ADVAIR) 250-50 MCG/ACT AEPB diskus inhaler, Inhale 1 puff into the lungs in the morning and 1 puff in the evening., Disp: 60 each, Rfl: 5    albuterol sulfate HFA (PROVENTIL;VENTOLIN;PROAIR) 108 (90 Base) MCG/ACT inhaler, Inhale 2 puffs into the lungs 4 times daily, Disp: 18 g, Rfl: 5    traZODone (DESYREL) 150 MG tablet, Take 1-2 tablets by mouth nightly, Disp: 60 tablet, Rfl: 5    pantoprazole (PROTONIX) 40 MG tablet, Take 1 tablet by mouth daily, Disp: 90 tablet, Rfl: 1  Allergies   Allergen Reactions    Latex Swelling    Hydromorphone Nausea And Vomiting    Codeine Nausea And Vomiting and Dizziness or Vertigo       Review of Systems   Constitutional:  Negative for chills and fever.   HENT:  Positive for congestion, ear pain and postnasal drip.    Respiratory:  Positive for cough. Negative for shortness of breath.    Cardiovascular:  Negative for chest pain and palpitations.   Gastrointestinal:  Negative for abdominal pain, blood in stool, constipation, diarrhea, nausea and vomiting.   Musculoskeletal:  Positive for arthralgias, back pain and gait problem. Negative for myalgias.   Neurological:  Negative for dizziness, numbness and headaches.   Psychiatric/Behavioral:  Positive for sleep disturbance. Negative for dysphoric mood. The patient is not nervous/anxious.           Objective   Vitals:  06/22/22 1459   BP: 122/78   Site: Left Upper Arm   Position: Sitting   Cuff Size: Small Adult   Pulse: 93   Resp: 16   Temp: 98.7 F (37.1 C)   TempSrc: Oral    SpO2: 96%   Weight: 54.4 kg (120 lb)   Height: 1.626 m (5\' 4" )      Physical Exam  Vitals and nursing note reviewed.   Constitutional:       General: She is not in acute distress.     Appearance: Normal appearance. She is not ill-appearing.   HENT:      Mouth/Throat:      Mouth: Mucous membranes are moist.      Pharynx: Oropharynx is clear.   Cardiovascular:      Rate and Rhythm: Regular rhythm.      Heart sounds: No murmur heard.  Pulmonary:      Effort: Pulmonary effort is normal.      Breath sounds: Normal breath sounds. No wheezing.   Abdominal:      General: Bowel sounds are normal. There is no distension.      Palpations: Abdomen is soft.      Tenderness: There is no abdominal tenderness.   Musculoskeletal:      Right lower leg: No edema.      Left lower leg: No edema.   Neurological:      Mental Status: She is alert.   Psychiatric:         Mood and Affect: Mood normal.         Behavior: Behavior normal.                   ASSESSMENT/PLAN:  1. Establishing care with new doctor, encounter for  2. Mild persistent asthma without complication  -     fluticasone-salmeterol (ADVAIR) 250-50 MCG/ACT AEPB diskus inhaler; Inhale 1 puff into the lungs in the morning and 1 puff in the evening., Disp-60 each, R-5Normal  -     albuterol sulfate HFA (PROVENTIL;VENTOLIN;PROAIR) 108 (90 Base) MCG/ACT inhaler; Inhale 2 puffs into the lungs 4 times daily, Disp-18 g, R-5Normal  3. Attention deficit hyperactivity disorder (ADHD), predominantly inattentive type  -     Amb External Referral To Behavioral Health  4. Bipolar affective disorder, remission status unspecified (HCC)  -     Amb External Referral To Behavioral Health  5. Primary insomnia  -     traZODone (DESYREL) 150 MG tablet; Take 1-2 tablets by mouth nightly, Disp-60 tablet, R-5Normal  6. Gastroesophageal reflux disease without esophagitis  -     pantoprazole (PROTONIX) 40 MG tablet; Take 1 tablet by mouth daily, Disp-90 tablet, R-1Normal  7. History of gastric  ulcer  8. Chronic bilateral low back pain without sciatica  -     [facility] - , MD, Orthopedic Surgery (hip), [facility])  9. Bilateral hip joint arthritis  -     [facility] - [facility], MD, Orthopedic Surgery (hip),  [facility])  10. Edema of both lower extremities  11. Personal history of tobacco use  -     PR VISIT TO DISCUSS LUNG CA SCREEN W LDCT  -     CT Lung Screen (Initial/Annual/Baseline); Future  12. Encounter for screening mammogram for breast cancer  -     MAM DIGITAL SCREEN W OR WO CAD BILATERAL; Future  13. Screening for osteoporosis  -     DEXA BONE DENSITY AXIAL SKELETON; Future  14. Postmenopausal  -     DEXA BONE DENSITY AXIAL SKELETON; Future         Patient presents for establish care visit with me.  Acute concerns addressed.  Chronic problems reviewed.  Medications and history reviewed.  See below for additional information.  Mild persistent asthma: Will start Advair in addition to albuterol as needed. Follow-up in 1 month  ADHD: Chronic and stable. Did not refill Adderall today. Referral placed to behavioral health/psychiatry.  Bipolar disorder: Referral placed to psychiatry  Insomnia: Patient is on trazodone 300 mg nightly. Discussed that this is a high dose. Recommended patient decrease. Will send in 150 mg tablets for patient to try to taper down.  GERD with history of ulcers: Chronic and stable. Refilled protonix for 40 mg daily  See #7 above  Chronic back pain: Referral placed to orthopedic surgery  Chronic hip pain/arthritis: Referral placed to orthopedic surgery  Edema: Chronic and stable. Continue lasix as needed  Smoking: Patient still using cigarettes. Counseled briefly, but patient did not want to quit.   Discussed with the patient the current USPSTF guidelines released July 23, 2019 for screening for lung cancer.    For adults aged 13 to 22 years who have a 20 pack-year smoking history and currently smoke or have quit within the past 15 years the  grade B recommendation is to:  Screen for lung cancer with low-dose computed tomography (LDCT) every year.  Stop screening once a person has not smoked for 15 years or has a health problem that limits life expectancy or the ability to have lung surgery.    The patient  reports that she has been smoking cigarettes. She started smoking about 41 years ago. She has a 20.5 pack-year smoking history. She has never used smokeless tobacco.. Discussed with patient the risks and benefits of screening, including over-diagnosis, false positive rate, and total radiation exposure.  The patient currently exhibits no signs or symptoms suggestive of lung cancer.  Discussed with patient the importance of compliance with yearly annual lung cancer screenings and willingness to undergo diagnosis and treatment if screening scan is positive.  In addition, the patient was counseled regarding the importance of remaining smoke free and/or total smoking cessation.    Also reviewed the following if the patient has Medicare that as of June 25, 2020, Medicare only covers LDCT screening in patients aged 50-77 with at least a 20 pack-year smoking history who currently smoke or have quit in the last 15 years  Breast cancer screening was discussed today as patient is due for this. Patient was agreeable to obtaining a mammogram, which was ordered today. Patient encouraged to call to schedule this.  Patient is postmenopausal and >age 79. She is due for bone density screening which was discussed today. Patient is agreeable to obtaining this so order was placed.  See #13 above    Return in about 3 months (around 09/20/2022) for Medicare Wellness Exam.         An electronic signature was used to authenticate this note.    --Terri Skains, MD

## 2022-06-23 MED ORDER — FLUTICASONE PROPIONATE 50 MCG/ACT NA SUSP
50 | Freq: Two times a day (BID) | NASAL | 11 refills | Status: AC
Start: 2022-06-23 — End: ?

## 2022-06-23 NOTE — Telephone Encounter (Signed)
Pt states you did not call in her medication for the fluid in the ear the nasal spray Fluticasone 24mcg\act

## 2022-06-23 NOTE — Addendum Note (Signed)
Addended by: Terri Skains on: 06/23/2022 11:36 AM     Modules accepted: Orders

## 2022-07-21 ENCOUNTER — Ambulatory Visit
Admit: 2022-07-21 | Discharge: 2022-07-21 | Payer: MEDICARE | Attending: Student in an Organized Health Care Education/Training Program | Primary: Student in an Organized Health Care Education/Training Program

## 2022-07-21 DIAGNOSIS — J4530 Mild persistent asthma, uncomplicated: Secondary | ICD-10-CM

## 2022-07-21 DIAGNOSIS — Z114 Encounter for screening for human immunodeficiency virus [HIV]: Secondary | ICD-10-CM

## 2022-07-21 MED ORDER — DULOXETINE HCL 60 MG PO CPEP
60 | ORAL_CAPSULE | Freq: Every day | ORAL | 3 refills | Status: AC
Start: 2022-07-21 — End: ?

## 2022-07-21 MED ORDER — FUROSEMIDE 20 MG PO TABS
20 | ORAL_TABLET | Freq: Two times a day (BID) | ORAL | 5 refills | Status: AC | PRN
Start: 2022-07-21 — End: ?

## 2022-07-21 NOTE — Progress Notes (Signed)
 Rm: 13    Chief Complaint   Patient presents with    Follow-up        1. Have you been to the ER, urgent care clinic since your last visit?  Hospitalized since your last visit?no    2. Have you seen or consulted any other health care providers outside of the [facility] since your last visit?  Include any pap smears or colon screening. no        Social Determinants of Health     Tobacco Use: High Risk (07/21/2022)    Patient History     Smoking Tobacco Use: Every Day     Smokeless Tobacco Use: Never     Passive Exposure: Not on file   Alcohol Use: Not on file   Financial Resource Strain: Low Risk  (07/21/2022)    Overall Financial Resource Strain (CARDIA)     Difficulty of Paying Living Expenses: Not hard at all   Recent Concern: Financial Resource Strain - High Risk (06/22/2022)    Overall Financial Resource Strain (CARDIA)     Difficulty of Paying Living Expenses: Very hard   Food Insecurity: No Food Insecurity (07/21/2022)    Hunger Vital Sign     Worried About Running Out of Food in the Last Year: Never true     Ran Out of Food in the Last Year: Never true   Recent Concern: Food Insecurity - Food Insecurity Present (06/22/2022)    Hunger Vital Sign     Worried About Running Out of Food in the Last Year: Often true     Ran Out of Food in the Last Year: Often true   Transportation Needs: Unknown (07/21/2022)    PRAPARE - Therapist, Art (Medical): Not on file     Lack of Transportation (Non-Medical): No   Physical Activity: Not on file   Stress: Not on file   Social Connections: Not on file   Intimate Partner Violence: Not on file   Depression: Not at risk (07/21/2022)    PHQ-2     PHQ-2 Score: 0   Housing Stability: Unknown (07/21/2022)    Housing Stability Vital Sign     Unable to Pay for Housing in the Last Year: Not on file     Number of Places Lived in the Last Year: Not on file     Unstable Housing in the Last Year: No   Interpersonal Safety: Not on file   Utilities: Not on file

## 2022-07-21 NOTE — Progress Notes (Signed)
 Linda Hull (DOB:  [birth year]) is a 67 y.o. female, Established patient, here for evaluation of the following chief complaint(s):  Follow-up        Subjective   SUBJECTIVE/OBJECTIVE:  Patient presents today for follow-up for the following:    Acute concerns: Want's labs done    Chronic problems:  Allergies/congestion: On flonase   Mild persistent asthma - On advair  BID and has albuterol  PRN  ADHD: States she is on Adderall 20 mg daily  Bipolar II: has not seen psychiatrist in 11-12 years. States this is well controlled. I did note she is on Cymbalta  60 mg, but no medications for bipolar - need refill  GERD with history of duodenal/gastric ulcers: Patient is on protonix  40 mg daily  Leg swelling: She has lasix  that she uses as needed - 20 mg PRN - has gained some weight recently. No known history of heart failure. Has had issues with swelling since age 11.    Preventative care:  Health Maintenance Due   Topic Date Due    Pneumococcal 65+ years Vaccine (1 - PCV) Never done    HIV screen  Never done    Hepatitis C screen  Never done    DTaP/Tdap/Td vaccine (1 - Tdap) Never done    Cervical cancer screen  Never done    Lipids  Never done    Colorectal Cancer Screen  Never done    Breast cancer screen  Never done    Shingles vaccine (1 of 2) Never done    Low dose CT lung screening &/or counseling  Never done    DEXA (modify frequency per FRAX score)  Never done    Respiratory Syncytial Virus (RSV) Pregnant or age 78 yrs+ (1 - 1-dose 60+ series) Never done          Past Medical History:   Diagnosis Date    Duodenal ulcer 07/01/2021    Gastric ulcer 07/01/2021     History reviewed. No pertinent surgical history.   Family History   Problem Relation Age of Onset    Lung Cancer Mother         non smoker    Hypertension Mother     Thalassemia Mother     High Blood Pressure Father     High Cholesterol Father     High Cholesterol Brother     Diabetes Brother     High Cholesterol Brother     Diabetes Brother      Social History      Socioeconomic History    Marital status: Single     Spouse name: Not on file    Number of children: Not on file    Years of education: Not on file    Highest education level: Not on file   Occupational History    Not on file   Tobacco Use    Smoking status: Every Day     Current packs/day: 0.50     Average packs/day: 0.5 packs/day for 41.1 years (20.5 ttl pk-yrs)     Types: Cigarettes     Start date: 06/22/1981    Smokeless tobacco: Never   Substance and Sexual Activity    Alcohol use: Never    Drug use: Never    Sexual activity: Not on file   Other Topics Concern    Not on file   Social History Narrative    Not on file     Social Determinants of Health     Financial Resource Strain:  Low Risk  (07/21/2022)    Overall Financial Resource Strain (CARDIA)     Difficulty of Paying Living Expenses: Not hard at all   Recent Concern: Financial Resource Strain - High Risk (06/22/2022)    Overall Financial Resource Strain (CARDIA)     Difficulty of Paying Living Expenses: Very hard   Food Insecurity: No Food Insecurity (07/21/2022)    Hunger Vital Sign     Worried About Running Out of Food in the Last Year: Never true     Ran Out of Food in the Last Year: Never true   Recent Concern: Food Insecurity - Food Insecurity Present (06/22/2022)    Hunger Vital Sign     Worried About Running Out of Food in the Last Year: Often true     Ran Out of Food in the Last Year: Often true   Transportation Needs: Unknown (07/21/2022)    PRAPARE - Therapist, Art (Medical): Not on file     Lack of Transportation (Non-Medical): No   Physical Activity: Not on file   Stress: Not on file   Social Connections: Not on file   Intimate Partner Violence: Not on file   Housing Stability: Unknown (07/21/2022)    Housing Stability Vital Sign     Unable to Pay for Housing in the Last Year: Not on file     Number of Places Lived in the Last Year: Not on file     Unstable Housing in the Last Year: No       Current Outpatient Medications:      furosemide  (LASIX ) 20 MG tablet, Take 1 tablet by mouth 2 times daily as needed (leg swelling), Disp: 60 tablet, Rfl: 5    DULoxetine  (CYMBALTA ) 60 MG extended release capsule, Take 1 capsule by mouth daily, Disp: 90 capsule, Rfl: 3    fluticasone  (FLONASE  ALLERGY RELIEF) 50 MCG/ACT nasal spray, 1 spray by Nasal route in the morning and at bedtime, Disp: 16 g, Rfl: 11    magnesium chloride (MAG64) 64 MG TBEC extended release tablet, Take 1 tablet by mouth daily (with breakfast), Disp: , Rfl:     amphetamine -dextroamphetamine  (ADDERALL) 20 MG tablet, Take 1 tablet by mouth daily., Disp: , Rfl:     fluticasone -salmeterol (ADVAIR ) 250-50 MCG/ACT AEPB diskus inhaler, Inhale 1 puff into the lungs in the morning and 1 puff in the evening., Disp: 60 each, Rfl: 5    albuterol  sulfate HFA (PROVENTIL ;VENTOLIN ;PROAIR ) 108 (90 Base) MCG/ACT inhaler, Inhale 2 puffs into the lungs 4 times daily, Disp: 18 g, Rfl: 5    traZODone  (DESYREL ) 150 MG tablet, Take 1-2 tablets by mouth nightly, Disp: 60 tablet, Rfl: 5    pantoprazole  (PROTONIX ) 40 MG tablet, Take 1 tablet by mouth daily, Disp: 90 tablet, Rfl: 1  Allergies   Allergen Reactions    Latex Swelling    Hydromorphone Nausea And Vomiting    Codeine Nausea And Vomiting and Dizziness or Vertigo       Review of Systems   Constitutional:  Negative for chills and fever.   HENT:  Positive for congestion, ear pain and postnasal drip.    Respiratory:  Positive for cough. Negative for shortness of breath.    Cardiovascular:  Negative for chest pain and palpitations.   Gastrointestinal:  Negative for abdominal pain, blood in stool, constipation, diarrhea, nausea and vomiting.   Musculoskeletal:  Positive for arthralgias, back pain and gait problem. Negative for myalgias.   Neurological:  Negative  for dizziness, numbness and headaches.   Psychiatric/Behavioral:  Positive for sleep disturbance. Negative for dysphoric mood. The patient is not nervous/anxious.           Objective   Vitals:     07/21/22 1012   BP: 104/69   Site: Left Upper Arm   Position: Sitting   Cuff Size: Small Adult   Pulse: 94   Resp: 16   Temp: 97.3 F (36.3 C)   TempSrc: Temporal   SpO2: 98%   Weight: 59.6 kg (131 lb 6.4 oz)   Height: 1.626 m (5' 4)      Physical Exam  Vitals and nursing note reviewed.   Constitutional:       General: She is not in acute distress.     Appearance: Normal appearance. She is not ill-appearing.   HENT:      Mouth/Throat:      Mouth: Mucous membranes are moist.      Pharynx: Oropharynx is clear.   Cardiovascular:      Rate and Rhythm: Regular rhythm.      Heart sounds: No murmur heard.  Pulmonary:      Effort: Pulmonary effort is normal.      Breath sounds: Normal breath sounds. No wheezing.   Abdominal:      General: Bowel sounds are normal. There is no distension.      Palpations: Abdomen is soft.      Tenderness: There is no abdominal tenderness.   Musculoskeletal:      Right lower leg: No edema.      Left lower leg: No edema.   Neurological:      Mental Status: She is alert.   Psychiatric:         Mood and Affect: Mood normal.         Behavior: Behavior normal.                   ASSESSMENT/PLAN:  1. Mild persistent asthma without complication  2. Attention deficit hyperactivity disorder (ADHD), predominantly inattentive type  3. Bipolar affective disorder, remission status unspecified (HCC)  -     DULoxetine  (CYMBALTA ) 60 MG extended release capsule; Take 1 capsule by mouth daily, Disp-90 capsule, R-3Normal  4. Primary insomnia  5. Gastroesophageal reflux disease without esophagitis  6. History of gastric ulcer  7. Iron deficiency anemia, unspecified iron deficiency anemia type  -     CBC with Auto Differential; Future  -     Iron and TIBC; Future  -     Ferritin; Future  8. 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 Chronic bilateral low back pain without sciatica  10. Bilateral hip joint arthritis  11. Edema of both lower extremities  -     furosemide  (LASIX ) 20 MG tablet; Take 1 tablet by mouth 2 times daily as  needed (leg swelling), Disp-60 tablet, R-5Normal  -     Comprehensive Metabolic Panel; Future  -     TSH; Future  -     Brain Natriuretic Peptide; Future  12. Elevated cholesterol  -     Lipid Panel; Future  13. Personal history of tobacco use  14. At high risk for falls  15. Screening for HIV (human immunodeficiency virus)  -     HIV 1/2 Ag/Ab, 4TH Generation,W Rflx Confirm; Future  16. Encounter for hepatitis C screening test for low risk patient  -     Hepatitis C Ab, Rflx to Qt by PCR; Future  17. Wheezing  -  Brain Natriuretic Peptide; Future  18. Encounter for immunization  -     Tdap, BOOSTRIX, (age 24 yrs+), IM         Patient presents today for follow-up for the above issues. See plan below:  Mild persistent asthma: Encouraged patient to start Advair  in addition to albuterol  as needed.   Wheezing: encouraged patient to take the Advair   ADHD: Chronic and stable. Did not refill Adderall today. Referral placed to behavioral health/psychiatry.  Bipolar disorder: Referral placed to psychiatry  Insomnia: Patient is on trazodone  300 mg nightly. Discussed that this is a high dose. Recommended patient decrease. Will send in 150 mg tablets for patient to try to taper down.  GERD with history of ulcers: Chronic and stable. Refilled protonix  for 40 mg daily  See #7 above'  Iron deficiency anemia: Chronic and stable. 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 Continue lasix  as needed - refilled today. Will check chem panel, BMP  Elevated Cholesterol in the past: Will check lipid panel today  Smoking: Patient still using cigarettes. Counseled briefly, but patient did not want to quit.   On the basis of positive falls risk screening, assessment and plan is as  follows: home safety tips provided.  Discussed once in lifetime HIV screening for which patient was agreeable to obtaining.  HIV test ordered.  Discussed once in lifetime Hep C screening for which patient was agreeable to obtaining.  Hep C screen ordered.  Patient is due for tetanus vaccine. Patient left before it could be administered - will defer to next visit.      Return in 2 months (on 09/20/2022) for Wellness Exam.         An electronic signature was used to authenticate this note.    --Heide Cowing, MD

## 2022-07-22 LAB — CBC WITH AUTO DIFFERENTIAL
Absolute Immature Granulocyte: 0 10*3/uL (ref 0.00–0.04)
Basophils %: 1 % (ref 0–1)
Basophils Absolute: 0.1 10*3/uL (ref 0.0–0.1)
Eosinophils %: 2 % (ref 0–7)
Eosinophils Absolute: 0.2 10*3/uL (ref 0.0–0.4)
Hematocrit: 37.5 % (ref 35.0–47.0)
Hemoglobin: 11.3 g/dL — ABNORMAL LOW (ref 11.5–16.0)
Immature Granulocytes: 0 % (ref 0.0–0.5)
Lymphocytes %: 19 % (ref 12–49)
Lymphocytes Absolute: 1.9 10*3/uL (ref 0.8–3.5)
MCH: 22.1 PG — ABNORMAL LOW (ref 26.0–34.0)
MCHC: 30.1 g/dL (ref 30.0–36.5)
MCV: 73.2 FL — ABNORMAL LOW (ref 80.0–99.0)
MPV: 10.6 FL (ref 8.9–12.9)
Monocytes %: 6 % (ref 5–13)
Monocytes Absolute: 0.6 10*3/uL (ref 0.0–1.0)
Neutrophils %: 72 % (ref 32–75)
Neutrophils Absolute: 7.3 10*3/uL (ref 1.8–8.0)
Nucleated RBCs: 0 PER 100 WBC
Platelets: 394 10*3/uL (ref 150–400)
RBC: 5.12 M/uL (ref 3.80–5.20)
RDW: 17.6 % — ABNORMAL HIGH (ref 11.5–14.5)
WBC: 10 10*3/uL (ref 3.6–11.0)
nRBC: 0 10*3/uL (ref 0.00–0.01)

## 2022-07-22 LAB — COMPREHENSIVE METABOLIC PANEL
ALT: 16 U/L (ref 12–78)
AST: 19 U/L (ref 15–37)
Albumin/Globulin Ratio: 1.4 (ref 1.1–2.2)
Albumin: 4.2 g/dL (ref 3.5–5.0)
Alk Phosphatase: 74 U/L (ref 45–117)
Anion Gap: 6 mmol/L (ref 5–15)
BUN: 13 MG/DL (ref 6–20)
Bun/Cre Ratio: 14 (ref 12–20)
CO2: 26 mmol/L (ref 21–32)
Calcium: 9.5 MG/DL (ref 8.5–10.1)
Chloride: 106 mmol/L (ref 97–108)
Creatinine: 0.9 MG/DL (ref 0.55–1.02)
Est, Glom Filt Rate: >60 mL/min/{1.73_m2} (ref >60)
Globulin: 3.1 g/dL (ref 2.0–4.0)
Glucose: 115 mg/dL — ABNORMAL HIGH (ref 65–100)
Potassium: 4.2 mmol/L (ref 3.5–5.1)
Sodium: 138 mmol/L (ref 136–145)
Total Bilirubin: 0.4 MG/DL (ref 0.2–1.0)
Total Protein: 7.3 g/dL (ref 6.4–8.2)

## 2022-07-22 LAB — IRON AND TIBC
Iron % Saturation: 29 % (ref 20–50)
Iron: 104 ug/dL (ref 35–150)
TIBC: 354 ug/dL (ref 250–450)

## 2022-07-22 LAB — HIV 1/2 AG/AB, 4TH GENERATION,W RFLX CONFIRM: HIV 1/2 Interp: NONREACTIVE

## 2022-07-22 LAB — LIPID PANEL
Chol/HDL Ratio: 5.1 — ABNORMAL HIGH (ref 0.0–5.0)
Cholesterol, Total: 274 MG/DL — ABNORMAL HIGH (ref <200)
HDL: 54 MG/DL
LDL Calculated: 155 MG/DL — ABNORMAL HIGH (ref 0–100)
Triglycerides: 325 MG/DL — ABNORMAL HIGH (ref <150)
VLDL Cholesterol Calculated: 65 MG/DL

## 2022-07-22 LAB — TSH: TSH, 3RD GENERATION: 0.87 u[IU]/mL (ref 0.36–3.74)

## 2022-07-22 LAB — BRAIN NATRIURETIC PEPTIDE: NT Pro-BNP: 181 PG/ML — ABNORMAL HIGH (ref <125)

## 2022-07-22 LAB — FERRITIN: Ferritin: 60 NG/ML (ref 26–388)

## 2022-07-22 NOTE — Telephone Encounter (Signed)
We received a referral from pcp to schedule an appt, left pt voicemail to call our office and schedule an appt

## 2022-07-24 LAB — HCV INTERPRETATION

## 2022-07-24 LAB — HEPATITIS C AB, RFLX TO QT BY PCR: Hepatitis C Ab: NONREACTIVE s/co ratio

## 2022-09-14 ENCOUNTER — Ambulatory Visit
Attending: Student in an Organized Health Care Education/Training Program | Primary: Student in an Organized Health Care Education/Training Program

## 2022-09-20 ENCOUNTER — Ambulatory Visit
Admit: 2022-09-20 | Payer: MEDICARE | Attending: Student in an Organized Health Care Education/Training Program | Primary: Student in an Organized Health Care Education/Training Program

## 2022-09-20 DIAGNOSIS — Z0000 Encounter for general adult medical examination without abnormal findings: Secondary | ICD-10-CM

## 2022-09-20 NOTE — Progress Notes (Signed)
[id number]    Chief Complaint   Patient presents with    Medicare AWV     Pt stated she would like to a MRI for her back and also she stated she would like to discuss her blood work Result today.       Vitals:    09/20/22 1103   BP: 127/85   Site: Left Upper Arm   Position: Sitting   Pulse: 96   Temp: 98.7 F (37.1 C)   TempSrc: Oral   SpO2: 96%   Weight: 58.3 kg (128 lb 9.6 oz)       "Have you been to the ER, urgent care clinic since your last visit?  Hospitalized since your last visit?"    NO    "Have you seen or consulted any other health care providers outside of [facility] since your last visit?"    NO    Have you had a mammogram?"   NO    No breast cancer screening on file      "Have you had a pap smear?"    NO    No cervical cancer screening on file         "Have you had a colorectal cancer screening such as a colonoscopy/FIT/Cologuard?    NO    No colonoscopy on file  No cologuard on file  No FIT/FOBT on file   No flexible sigmoidoscopy on file         Click Here for Release of Records Request   AVS  education, follow up, and recommendations provided and addressed with patient. Interpreter services used to advise patient No

## 2022-09-20 NOTE — Progress Notes (Signed)
Linda Hull (DOB:  [birth year]) is a 67 y.o. female, Established patient, here for evaluation of the following chief complaint(s):  Medicare AWV (Pt stated she would like to a MRI for her back and also she stated she would like to discuss her blood work Result today.)        Subjective   SUBJECTIVE/OBJECTIVE:  Patient presents today for follow-up for the following:    Chronic problems:  Chronic back, hip and neck pain: has upcoming appointment with Ortho. Wants MRI. Discussed PT. Open to having this done. She does use a walker to get around  Allergies/congestion: On flonase  Mild persistent asthma - On advair BID and has albuterol PRN - has barely needed it so she has cut down on Advair  ADHD: States she is on Adderall 20 mg daily  Bipolar II: has not seen psychiatrist in 11-12 years. States this is well controlled. I did note she is on Cymbalta 60 mg, but no medications for bipolar  GERD with history of duodenal/gastric ulcers: Patient is on protonix 40 mg daily  Leg swelling: She has lasix that she uses as needed - 20 mg PRN - No known history of heart failure. Has had issues with swelling since age 20.    Preventative care:  Health Maintenance Due   Topic Date Due    Cervical cancer screen  Never done    Colorectal Cancer Screen  Never done    Breast cancer screen  Never done    Low dose CT lung screening &/or counseling  Never done    DEXA (modify frequency per FRAX score)  Never done      Pap smear: Been >5 years  Colonoscopy: 14-15 years ago. Discussed repeat - patient opted for Cologuard  Patient needs to schedule CT lung, Mammogram, and DEXA  Discussed vaccinations - refused    Past Medical History:   Diagnosis Date    Duodenal ulcer 07/01/2021    Gastric ulcer 07/01/2021     History reviewed. No pertinent surgical history.   Family History   Problem Relation Age of Onset    Lung Cancer Mother         non smoker    Hypertension Mother     Thalassemia Mother     High Blood Pressure Father     High Cholesterol  Father     High Cholesterol Brother     Diabetes Brother     High Cholesterol Brother     Diabetes Brother      Social History     Socioeconomic History    Marital status: Single     Spouse name: Not on file    Number of children: Not on file    Years of education: Not on file    Highest education level: Not on file   Occupational History    Not on file   Tobacco Use    Smoking status: Every Day     Current packs/day: 0.50     Average packs/day: 0.5 packs/day for 41.2 years (20.6 ttl pk-yrs)     Types: Cigarettes     Start date: 06/22/1981    Smokeless tobacco: Never   Substance and Sexual Activity    Alcohol use: Never    Drug use: Never    Sexual activity: Not on file   Other Topics Concern    Not on file   Social History Narrative    Not on file     Social Determinants of Health  Financial Resource Strain: Low Risk  (07/21/2022)    Overall Financial Resource Strain (CARDIA)     Difficulty of Paying Living Expenses: Not hard at all   Recent Concern: Financial Resource Strain - High Risk (06/22/2022)    Overall Financial Resource Strain (CARDIA)     Difficulty of Paying Living Expenses: Very hard   Food Insecurity: No Food Insecurity (07/21/2022)    Hunger Vital Sign     Worried About Running Out of Food in the Last Year: Never true     Ran Out of Food in the Last Year: Never true   Recent Concern: Food Insecurity - Food Insecurity Present (06/22/2022)    Hunger Vital Sign     Worried About Running Out of Food in the Last Year: Often true     Ran Out of Food in the Last Year: Often true   Transportation Needs: Unknown (07/21/2022)    PRAPARE - Therapist, art (Medical): Not on file     Lack of Transportation (Non-Medical): No   Physical Activity: Inactive (09/20/2022)    Exercise Vital Sign     Days of Exercise per Week: 0 days     Minutes of Exercise per Session: 0 min   Stress: Not on file   Social Connections: Not on file   Intimate Partner Violence: Not on file   Housing Stability: Unknown  (07/21/2022)    Housing Stability Vital Sign     Unable to Pay for Housing in the Last Year: Not on file     Number of Places Lived in the Last Year: Not on file     Unstable Housing in the Last Year: No       Current Outpatient Medications:     furosemide (LASIX) 20 MG tablet, Take 1 tablet by mouth 2 times daily as needed (leg swelling), Disp: 60 tablet, Rfl: 5    DULoxetine (CYMBALTA) 60 MG extended release capsule, Take 1 capsule by mouth daily, Disp: 90 capsule, Rfl: 3    magnesium chloride (MAG64) 64 MG TBEC extended release tablet, Take 1 tablet by mouth daily (with breakfast), Disp: , Rfl:     amphetamine-dextroamphetamine (ADDERALL) 20 MG tablet, Take 1 tablet by mouth daily., Disp: , Rfl:     albuterol sulfate HFA (PROVENTIL;VENTOLIN;PROAIR) 108 (90 Base) MCG/ACT inhaler, Inhale 2 puffs into the lungs 4 times daily, Disp: 18 g, Rfl: 5    traZODone (DESYREL) 150 MG tablet, Take 1-2 tablets by mouth nightly, Disp: 60 tablet, Rfl: 5    pantoprazole (PROTONIX) 40 MG tablet, Take 1 tablet by mouth daily, Disp: 90 tablet, Rfl: 1    fluticasone (FLONASE ALLERGY RELIEF) 50 MCG/ACT nasal spray, 1 spray by Nasal route in the morning and at bedtime (Patient not taking: Reported on 09/20/2022), Disp: 16 g, Rfl: 11    fluticasone-salmeterol (ADVAIR) 250-50 MCG/ACT AEPB diskus inhaler, Inhale 1 puff into the lungs in the morning and 1 puff in the evening. (Patient not taking: Reported on 09/20/2022), Disp: 60 each, Rfl: 5  Allergies   Allergen Reactions    Latex Swelling    Hydromorphone Nausea And Vomiting    Codeine Nausea And Vomiting and Dizziness or Vertigo       Review of Systems   Constitutional:  Negative for chills and fever.   HENT:  Negative for congestion, ear pain and postnasal drip.    Respiratory:  Negative for cough and shortness of breath.    Cardiovascular:  Negative  for chest pain and palpitations.   Gastrointestinal:  Negative for abdominal pain, blood in stool, constipation, diarrhea, nausea and vomiting.    Musculoskeletal:  Positive for arthralgias, back pain, gait problem, neck pain and neck stiffness. Negative for myalgias.   Neurological:  Negative for dizziness, numbness and headaches.   Psychiatric/Behavioral:  Positive for decreased concentration and sleep disturbance. Negative for dysphoric mood. The patient is not nervous/anxious.           Objective   Vitals:    09/20/22 1103   BP: 127/85   Site: Left Upper Arm   Position: Sitting   Pulse: 96   Temp: 98.7 F (37.1 C)   TempSrc: Oral   SpO2: 96%   Weight: 58.3 kg (128 lb 9.6 oz)      Physical Exam  Vitals and nursing note reviewed.   Constitutional:       General: She is not in acute distress.     Appearance: Normal appearance. She is not ill-appearing.   HENT:      Mouth/Throat:      Mouth: Mucous membranes are moist.      Pharynx: Oropharynx is clear.   Cardiovascular:      Rate and Rhythm: Regular rhythm.      Heart sounds: No murmur heard.  Pulmonary:      Effort: Pulmonary effort is normal.      Breath sounds: Normal breath sounds. No wheezing.   Abdominal:      General: Bowel sounds are normal. There is no distension.      Palpations: Abdomen is soft.      Tenderness: There is no abdominal tenderness.   Musculoskeletal:      Right lower leg: No edema.      Left lower leg: No edema.   Neurological:      Mental Status: She is alert.   Psychiatric:         Mood and Affect: Mood normal.         Behavior: Behavior normal.                Assessment & Plan   ASSESSMENT/PLAN:  1. Medicare annual wellness visit, subsequent  2. Mild persistent asthma without complication  3. Attention deficit hyperactivity disorder (ADHD), predominantly inattentive type  4. Bipolar affective disorder, remission status unspecified (HCC)  5. Primary insomnia  6. Gastroesophageal reflux disease without esophagitis  7. History of gastric ulcer  8. Iron deficiency anemia, unspecified iron deficiency anemia type  9. 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 Chronic bilateral low back pain without  sciatica  [facility] - Physical Therapy at [facility], [place]  11. Bilateral hip joint arthritis  -     [facility] - Physical Therapy at [facility], [place]  12. Edema of both lower extremities  13. Elevated cholesterol  14. Personal history of tobacco use  15. Advanced directives, counseling/discussion  -     [facility] -  Referral to [facility] Clinical Specialist  16. Screening for colorectal cancer  -     Cologuard (Fecal DNA Colorectal Cancer Screening)         Patient presents today for follow-up for the above issues. See plan below:  Patient completed all required medicare wellness visit forms and documentation today. A personalized prevention plan was made, reviewed and provided to the patient today. See additional document forms and below for full details. Patient is in good health and no deficits were identified aside from above issues which  are chronic and stable with exception of any acute problems or changes made above. All necessary medications were refilled. All other concerns were addressed.  Mild persistent asthma: Taking albuterol as needed. Has Advair, but has not used it regularly  ADHD: Chronic and stable. Referral placed to behavioral health/psychiatry. But patient did not want to see. Wants me to take over medications. Discussed we will need prior records which were requested.  Bipolar disorder: Referral placed to psychiatry. Patient states symptoms are stable and she wants to hold off. Will readdress at upcoming visit.  Insomnia: Patient is on trazodone 150 mg nightly. Continue for now.  GERD with history of ulcers: Chronic and stable. Continue protonix for 40 mg daily  See #7 above'  Iron deficiency anemia: Resolved  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: noted - check CBC per above  Chronic back pain: Uncontrolled. Referral placed to PT. Patient has upcoming appointment with orthopedic surgery.  Chronic hip pain/arthritis: Uncontrolled. Referral placed to PT. Patient has upcoming appointment with orthopedic  surgery.  Edema: Chronic and stable. Continue lasix as needed   Elevated Cholesterol: Chronic and uncontrolled. Patient does not want to be on medication. Diet and lifestyle re-inforced - consider fish oil.  Smoking: Patient still using cigarettes. Counseled briefly, but patient did not want to quit.   Briefly discussed over advanced care planning today. Patient would like referral to ACP and further discussions about this. Referral placed today.  Discussed colon cancer screening with the patient as patient is due for this. Patient opted to have cologuard done.  Order was placed today.            Return in about 6 weeks (around 11/01/2022) for ADHD, need records.         An electronic signature was used to authenticate this note.    --Unk Lightning, MD    Medicare Annual Wellness Visit    Linda Hull is here for Medicare AWV (Pt stated she would like to a MRI for her back and also she stated she would like to discuss her blood work Result today.)    Assessment & Plan   Medicare annual wellness visit, subsequent  Mild persistent asthma without complication  Attention deficit hyperactivity disorder (ADHD), predominantly inattentive type  Bipolar affective disorder, remission status unspecified (HCC)  Primary insomnia  Gastroesophageal reflux disease without esophagitis  History of gastric ulcer  Iron deficiency anemia, unspecified iron deficiency anemia type  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  Chronic bilateral low back pain without sciatica  -     [facility] - Physical Therapy at [place], [place]  Bilateral hip joint arthritis  -     [facility] - Physical Therapy at [facility], [place]  Edema of both lower extremities  Elevated cholesterol  Personal history of tobacco use  Advanced directives, counseling/discussion  -     [facility] -  Referral to [facility] Clinical Specialist  Screening for colorectal cancer  -     Cologuard (Fecal DNA Colorectal Cancer Screening)  Recommendations for Preventive Services Due: see orders and patient  instructions/AVS.  Recommended screening schedule for the next 5-10 years is provided to the patient in written form: see Patient Instructions/AVS.     Return in about 6 weeks (around 11/01/2022) for ADHD, need records.     Subjective       Patient's complete Health Risk Assessment and screening values have been reviewed and are found in Flowsheets. The following problems were reviewed today and where indicated follow up appointments were made and/or referrals ordered.  Positive Risk Factor Screenings with Interventions:    Fall Risk:  Do you feel unsteady or are you worried about falling? : (!) yes  2 or more falls in past year?: no  Fall with injury in past year?: no     Interventions:    Reviewed medications, home hazards, visual acuity, and co-morbidities that can increase risk for falls  Referral: Physical Therapy (PT)  See AVS for additional education material             Activity, Diet, and Weight:  On average, how many days per week do you engage in moderate to strenuous exercise (like a brisk walk)?: 0 days  On average, how many minutes do you engage in exercise at this level?: 0 min    Do you eat balanced/healthy meals regularly?: (!) No    Body mass index is 22.07 kg/m.      Inactivity Interventions:  See AVS for additional education material  Do you eat balanced/healthy meals regularly Interventions:  See AVS for additional education material        Dentist Screen:  Have you seen the dentist within the past year?: (!) No    Intervention:  Recommended a couple of practices  See AVS for additional education material     Vision Screen:  Do you have difficulty driving, watching TV, or doing any of your daily activities because of your eyesight?: No  Have you had an eye exam within the past year?: (!) No  No results found.    Interventions:   Patient encouraged to make appointment with their eye specialist  See AVS for additional education material      Advanced Directives:  Do you have a Living Will?: (!)  No    Intervention:  has NO advanced directive  - referred to [facility] Coordinator                 Tobacco Use:  Tobacco Use: High Risk (09/20/2022)    Patient History     Smoking Tobacco Use: Every Day     Smokeless Tobacco Use: Never     Passive Exposure: Not on file     E-cigarette/Vaping       Questions Responses    E-cigarette/Vaping Use     Start Date     Passive Exposure     Quit Date     Counseling Given     Comments           Interventions:  Patient declined any further intervention or treatment                      Objective   Vitals:    09/20/22 1103   BP: 127/85   Site: Left Upper Arm   Position: Sitting   Pulse: 96   Temp: 98.7 F (37.1 C)   TempSrc: Oral   SpO2: 96%   Weight: 58.3 kg (128 lb 9.6 oz)      Body mass index is 22.07 kg/m.               Allergies   Allergen Reactions    Latex Swelling    Hydromorphone Nausea And Vomiting    Codeine Nausea And Vomiting and Dizziness or Vertigo     Prior to Visit Medications    Medication Sig Taking? Authorizing Provider   furosemide (LASIX) 20 MG tablet Take 1 tablet by mouth 2 times daily as needed (leg swelling) Yes Unk Lightning, MD  DULoxetine (CYMBALTA) 60 MG extended release capsule Take 1 capsule by mouth daily Yes Unk Lightning, MD   magnesium chloride (MAG64) 64 MG TBEC extended release tablet Take 1 tablet by mouth daily (with breakfast) Yes [provider]   amphetamine-dextroamphetamine (ADDERALL) 20 MG tablet Take 1 tablet by mouth daily. Yes [provider]   albuterol sulfate HFA (PROVENTIL;VENTOLIN;PROAIR) 108 (90 Base) MCG/ACT inhaler Inhale 2 puffs into the lungs 4 times daily Yes Unk Lightning, MD   traZODone (DESYREL) 150 MG tablet Take 1-2 tablets by mouth nightly Yes Unk Lightning, MD   pantoprazole (PROTONIX) 40 MG tablet Take 1 tablet by mouth daily Yes Unk Lightning, MD   fluticasone [facility] ALLERGY RELIEF) 50 MCG/ACT nasal spray 1 spray by Nasal route in the morning and at bedtime  Patient not taking: Reported on 09/20/2022  Unk Lightning, MD   fluticasone-salmeterol (ADVAIR) 250-50 MCG/ACT AEPB diskus inhaler Inhale 1 puff into the lungs in the morning and 1 puff in the evening.  Patient not taking: Reported on 09/20/2022  Unk Lightning, MD       CareTeam (Including outside providers/suppliers regularly involved in providing care):   Patient Care Team:  Unk Lightning, MD as PCP - General (Family Medicine)  Unk Lightning, MD as PCP - Empaneled Provider     Reviewed and updated this visit:  Tobacco  Allergies  Meds  Problems  Med Hx  Surg Hx  Soc Hx  Fam Hx

## 2022-09-20 NOTE — Patient Instructions (Signed)
Preventing Falls: Care Instructions  Injuries and health problems such as trouble walking or poor eyesight can increase your risk of falling. So can some medicines. But there are things you can do to help prevent falls. You can exercise to get stronger. You can also arrange your home to make it safer.    Talk to your doctor about the medicines you take. Ask if any of them increase the risk of falls and whether they can be changed or stopped.   Try to exercise regularly. It can help improve your strength and balance. This can help lower your risk of falling.     Practice fall safety and prevention.    Wear low-heeled shoes that fit well and give your feet good support. Talk to your doctor if you have foot problems that make this hard.  Carry a cellphone or wear a medical alert device that you can use to call for help.  Use stepladders instead of chairs to reach high objects. Don't climb if you're at risk for falls. Ask for help, if needed.  Wear the correct eyeglasses, if you need them.    Make your home safer.    Remove rugs, cords, clutter, and furniture from walkways.  Keep your house well lit. Use night-lights in hallways and bathrooms.  Install and use sturdy handrails on stairways.  Wear nonskid footwear, even inside. Don't walk barefoot or in socks without shoes.    Be safe outside.    Use handrails, curb cuts, and ramps whenever possible.  Keep your hands free by using a shoulder bag or backpack.  Try to walk in well-lit areas. Watch out for uneven ground, changes in pavement, and debris.  Be careful in the winter. Walk on the grass or gravel when sidewalks are slippery. Use de-icer on steps and walkways. Add non-slip devices to shoes.    Put grab bars and nonskid mats in your shower or tub and near the toilet. Try to use a shower chair or bath bench when bathing.   Get into a tub or shower by putting in your weaker leg first. Get out with your strong side first. Have a phone or medical alert device in  the bathroom with you.   Where can you learn more?  Go to https://www.bennett.info/ and enter G117 to learn more about "Preventing Falls: Care Instructions."  Current as of: July 17, 2023Content Version: 14.0   2006-2024 [employer], Inc[employer].   Care instructions adapted under license by [facility]. If you have questions about a medical condition or this instruction, always ask your healthcare professional. [place] any warranty or liability for your use of this information.           Learning About Being Active as an Older Adult  Why is being active important as you get older?     Being active is one of the best things you can do for your health. And it's never too late to start. Being active--or getting active, if you aren't already--has definite benefits. It can:  Give you more energy,  Keep your mind sharp.  Improve balance to reduce your risk of falls.  Help you manage chronic illness with fewer medicines.  No matter how old you are, how fit you are, or what health problems you have, there is a form of activity that will work for you. And the more physical activity you can do, the better your overall health will be.  What kinds of activity can help you  stay healthy?  Being more active will make your daily activities easier. Physical activity includes planned exercise and things you do in daily life. There are four types of activity:  Aerobic.  Doing aerobic activity makes your heart and lungs strong.  Includes walking, dancing, and gardening.  Aim for at least 2 hours spread throughout the week.  It improves your energy and can help you sleep better.  Muscle-strengthening.  This type of activity can help maintain muscle and strengthen bones.  Includes climbing stairs, using resistance bands, and lifting or carrying heavy loads.  Aim for at least twice a week.  It can help protect the knees and other joints.  Stretching.  Stretching gives you better  range of motion in joints and muscles.  Includes upper arm stretches, calf stretches, and gentle yoga.  Aim for at least twice a week, preferably after your muscles are warmed up from other activities.  It can help you function better in daily life.  Balancing.  This helps you stay coordinated and have good posture.  Includes heel-to-toe walking, tai chi, and certain types of yoga.  Aim for at least 3 days a week.  It can reduce your risk of falling.  Even if you have a hard time meeting the recommendations, it's better to be more active than less active. All activity done in each category counts toward your weekly total. You'd be surprised how daily things like carrying groceries, keeping up with grandchildren, and taking the stairs can add up.  What keeps you from being active?  If you've had a hard time being more active, you're not alone. Maybe you remember being able to do more. Or maybe you've never thought of yourself as being active. It's frustrating when you can't do the things you want. Being more active can help. What's holding you back?  Getting started.  Have a goal, but break it into easy tasks. Small steps build into big accomplishments.  Staying motivated.  If you feel like skipping your activity, remember your goal. Maybe you want to move better and stay independent. Every activity gets you one step closer.  Not feeling your best.  Start with 5 minutes of an activity you enjoy. Prove to yourself you can do it. As you get comfortable, increase your time.  You may not be where you want to be. But you're in the process of getting there. Everyone starts somewhere.  How can you find safe ways to stay active?  Talk with your doctor about any physical challenges you're facing. Make a plan with your doctor if you have a health problem or aren't sure how to get started with activity.  If you're already active, ask your doctor if there is anything you should change to stay safe as your body and health  change.  If you tend to feel dizzy after you take medicine, avoid activity at that time. Try being active before you take your medicine. This will reduce your risk of falls.  If you plan to be active at home, make sure to clear your space before you get started. Remove things like TV cords, coffee tables, and throw rugs. It's safest to have plenty of space to move freely.  The key to getting more active is to take it slow and steady. Try to improve only a little bit at a time. Pick just one area to improve on at first. And if an activity hurts, stop and talk to your doctor.  Where can you  learn more?  Go to https://www.bennett.info/ and enter P600 to learn more about "Learning About Being Active as an Older Adult."  Current as of: June 5, 2023Content Version: 14.0   2006-2024 Healthwise, Incorporated.   Care instructions adapted under license by [facility]. If you have questions about a medical condition or this instruction, always ask your healthcare professional. [place] any warranty or liability for your use of this information.           Learning About Dental Care for Older Adults  Dental care for older adults: Overview  Dental care for older people is much the same as for younger adults. But older adults do have concerns that younger adults do not. Older adults may have problems with gum disease and decay on the roots of their teeth. They may need missing teeth replaced or broken fillings fixed. Or they may have dentures that need to be cared for. Some older adults may have trouble holding a toothbrush.  You can help remind the person you are caring for to brush and floss their teeth or to clean their dentures. In some cases, you may need to do the brushing and other dental care tasks. People who have trouble using their hands or who have dementia may need this extra help.  How can you help with dental care?  Normal dental care  To keep the teeth and gums  healthy:  Brush the teeth with fluoride toothpaste twice a day--in the morning and at night--and floss at least once a day. Plaque can quickly build up on the teeth of older adults.  Watch for the signs of gum disease. These signs include gums that bleed after brushing or after eating hard foods, such as apples.  See a dentist regularly. Many experts recommend checkups every 6 months.  Keep the dentist up to date on any new medications the person is taking.  Encourage a balanced diet that includes whole grains, vegetables, and fruits, and that is low in saturated fat and sodium.  Encourage the person you're caring for not to use tobacco products. They can affect dental and general health.  Many older adults have a fixed income and feel that they can't afford dental care. But most towns and cities have programs in which dentists help older adults by lowering fees. Contact your area's public health offices or social services for information about dental care in your area.  Using a toothbrush  Older adults with arthritis sometimes have trouble brushing their teeth because they can't easily hold the toothbrush. Their hands and fingers may be stiff, painful, or weak. If this is the case, you can:  Offer an IT trainer toothbrush.  Enlarge the handle of a non-electric toothbrush by wrapping a sponge, an elastic bandage, or adhesive tape around it.  Push the toothbrush handle through a ball made of rubber or soft foam.  Make the handle longer and thicker by taping Popsicle sticks or tongue depressors to it.  You may also be able to buy special toothbrushes, toothpaste dispensers, and floss holders.  Your doctor may recommend a soft-bristle toothbrush if the person you care for bleeds easily. Bleeding can happen because of a health problem or from certain medicines.  A toothpaste for sensitive teeth may help if the person you care for has sensitive teeth.  How do you brush and floss someone's teeth?  If the person you are  caring for has a hard time cleaning their teeth on their own, you may need  to brush and floss their teeth for them. It may be easiest to have the person sit and face away from you, and to sit or stand behind them. That way you can steady their head against your arm as you reach around to floss and brush their teeth. Choose a place that has good lighting and is comfortable for both of you.  Before you begin, gather your supplies. You will need gloves, floss, a toothbrush, and a container to hold water if you are not near a sink. Wash and dry your hands well and put on gloves. Start by flossing:  Gently work a piece of floss between each of the teeth toward the gums. A plastic flossing tool may make this easier, and they are available at most drugstores.  Curve the floss around each tooth into a U-shape and gently slide it under the gum line.  Move the floss firmly up and down several times to scrape off the plaque.  After you've finished flossing, throw away the used floss and begin brushing:  Wet the brush and apply toothpaste.  Place the brush at a 45-degree angle where the teeth meet the gums. Press firmly, and move the brush in small circles over the surface of the teeth.  Be careful not to brush too hard. Vigorous brushing can make the gums pull away from the teeth and can scratch the tooth enamel.  Brush all surfaces of the teeth, on the tongue side and on the cheek side. Pay special attention to the front teeth and all surfaces of the back teeth.  Brush chewing surfaces with short back-and-forth strokes.  After you've finished, help the person rinse the remaining toothpaste from their mouth.  Where can you learn more?  Go to https://www.bennett.info/ and enter F944 to learn more about "[place] for Older Adults."  Current as of: August 6, 2023Content Version: 14.0   2006-2024 Healthwise, Incorporated.   Care instructions adapted under license by [facility]. If you  have questions about a medical condition or this instruction, always ask your healthcare professional. [place] any warranty or liability for your use of this information.           Learning About Vision Tests  What are vision tests?     The four most common vision tests are visual acuity tests, refraction, visual field tests, and color vision tests.  Visual acuity (sharpness) tests  These tests are used:  To see if you need glasses or contact lenses.  To monitor an eye problem.  To check an eye injury.  Visual acuity tests are done as part of routine exams. You may also have this test when you get your driver's license or apply for some types of jobs.  Visual field tests  These tests are used:  To check for vision loss in any area of your range of vision.  To screen for certain eye diseases.  To look for nerve damage after a stroke, head injury, or other problem that could reduce blood flow to the brain.  Refraction and color tests  A refraction test is done to find the right prescription for glasses and contact lenses.  A color vision test is done to check for color blindness.  Color vision is often tested as part of a routine exam. You may also have this test when you apply for a job where recognizing different colors is important, such as truck driving, Research officer, trade union, or the [employer].  How are vision  tests done?  Visual acuity test   You cover one eye at a time.  You read aloud from a wall chart across the room.  You read aloud from a small card that you hold in your hand.  Refraction   You look into a special device.  The device puts lenses of different strengths in front of each eye to see how strong your glasses or contact lenses need to be.  Visual field tests   Your doctor may have you look through special machines.  Or your doctor may simply have you stare straight ahead while they move a finger into and out of your field of vision.  Color vision test   You look at pieces of printed  test patterns in various colors. You say what number or symbol you see.  Your doctor may have you trace the number or symbol using a pointer.  How do these tests feel?  There is very little chance of having a problem from this test. If dilating drops are used for a vision test, they may make the eyes sting and cause a medicine taste in the mouth.  Follow-up care is a key part of your treatment and safety. Be sure to make and go to all appointments, and call your doctor if you are having problems. It's also a good idea to know your test results and keep a list of the medicines you take.  Where can you learn more?  Go to https://www.bennett.info/ and enter G551 to learn more about "Learning About Vision Tests."  Current as of: June 5, 2023Content Version: 14.0   2006-2024 Healthwise, Incorporated.   Care instructions adapted under license by [facility]. If you have questions about a medical condition or this instruction, always ask your healthcare professional. [place] any warranty or liability for your use of this information.           Eating Healthy Foods: Care Instructions  With every meal, you can make healthy food choices. Try to eat a variety of fruits, vegetables, whole grains, lean proteins, and low-fat dairy products. This can help you get the right balance of nutrients, including vitamins and minerals. Small changes add up over time. You can start by adding one healthy food to your meals each day.    Try to make half your plate fruits and vegetables, one-fourth whole grains, and one-fourth lean proteins. Try including dairy with your meals.   Eat more fruits and vegetables. Try to have them with most meals and snacks.   Foods for healthy eating    Fruits    These can be fresh, frozen, canned, or dried.  Try to choose whole fruit rather than fruit juice.  Eat a variety of colors.    Vegetables    These can be fresh, frozen, canned, or dried.  Beans,  peas, and lentils count too.    Whole grains    Choose whole-grain breads, cereals, and noodles.  Try brown rice.    Lean proteins    These can include lean meat, poultry, fish, and eggs.  You can also have tofu, beans, peas, lentils, nuts, and seeds.    Dairy    Try milk, yogurt, and cheese.  Choose low-fat or fat-free when you can.  If you need to, use lactose-free milk or fortified plant-based milk products, such as soy milk.    Water    Drink water when you're thirsty.  Limit sugar-sweetened drinks, including soda, fruit drinks, and sports  drinks.  Where can you learn more?  Go to https://www.bennett.info/ and enter T756 to learn more about "Eating Healthy Foods: Care Instructions."  Current as of: September 20, 2023Content Version: 14.0   2006-2024 Healthwise, Incorporated.   Care instructions adapted under license by [facility]. If you have questions about a medical condition or this instruction, always ask your healthcare professional. [place] any warranty or liability for your use of this information.           Advance Directives: Care Instructions  Overview  An advance directive is a legal way to state your wishes at the end of your life. It tells your family and your doctor what to do if you can't say what you want.  There are two main types of advance directives. You can change them any time your wishes change.  Living will.  This form tells your family and your doctor your wishes about life support and other treatment. The form is also called a declaration.  Medical power of attorney.  This form lets you name a person to make treatment decisions for you when you can't speak for yourself. This person is called a health care agent (health care proxy, health care surrogate). The form is also called a durable power of attorney for health care.  If you do not have an advance directive, decisions about your medical care may be made by a family member, or  by a doctor or a judge who doesn't know you.  It may help to think of an advance directive as a gift to the people who care for you. If you have one, they won't have to make tough decisions by themselves.  For more information, including forms for your state, see the [place] website ([employer]ing[employer]hu).  Follow-up care is a key part of your treatment and safety. Be sure to make and go to all appointments, and call your doctor if you are having problems. It's also a good idea to know your test results and keep a list of the medicines you take.  What should you include in an advance directive?  Many states have a unique advance directive form. (It may ask you to address specific issues.) Or you might use a universal form that's approved by many states.  If your form doesn't tell you what to address, it may be hard to know what to include in your advance directive. Use the questions below to help you get started.  Who do you want to make decisions about your medical care if you are not able to?  What life-support measures do you want if you have a serious illness that gets worse over time or can't be cured?  What are you most afraid of that might happen? (Maybe you're afraid of having pain, losing your independence, or being kept alive by machines.)  Where would you prefer to die? (Your home? A hospital? A nursing home?)  Do you want to donate your organs when you die?  Do you want certain religious practices performed before you die?  When should you call for help?  Be sure to contact your doctor if you have any questions.  Where can you learn more?  Go to https://www.bennett.info/ and enter R264 to learn more about "Advance Directives: Care Instructions."  Current as of: November 16, 2023Content Version: 14.0   2006-2024 Healthwise, Incorporated.   Care instructions adapted under license by [facility]. If you have questions about a medical condition or  this instruction,  always ask your healthcare professional. [place] any warranty or liability for your use of this information.           Learning About Lung Cancer Screening  What is screening for lung cancer?     Lung cancer screening is a way to find some lung cancers early, before a person has any symptoms of the cancer.  Lung cancer screening may help those who have the highest risk for lung cancer--people age 83 and older who are or were heavy smokers. For most people, who aren't at increased risk, screening for lung cancer probably isn't helpful.  Screening won't prevent cancer. And it may not find all lung cancers. Lung cancer screening may lower the risk of dying from lung cancer in a small number of people.  How is it done?  Lung cancer screening is done with a low-dose CT (computed tomography) scan. A CT scan uses X-rays, or radiation, to make detailed pictures of your body. Experts recommend that screening be done in medical centers that focus on finding and treating lung cancer.  Who is screening recommended for?  Lung cancer screening is recommended for people age 22 and older who are or were heavy smokers. That means people with a smoking history of at least 20 pack years. A pack year is a way to measure how heavy a smoker you are or were.  To figure out your pack years, multiply how many packs a day on average (assuming 20 cigarettes per pack) you have smoked by how many years you have smoked. For example:  If you smoked 1 pack a day for 20 years, that's 1 times 20. So you have a smoking history of 20 pack years.  If you smoked 2 packs a day for 10 years, that's 2 times 10. So you have a smoking history of 20 pack years.  Experts agree that screening is for people who have a high risk of lung cancer. But experts don't agree on what high risk means. Some say people age 88 or older with at least a 20-pack-year smoking history are high risk. Others say it's people age 30 or  older with a 30-pack-year history.  To see if you could benefit from screening, first find out if you are at high risk for lung cancer. Your doctor can help you decide your lung cancer risk.  What are the risks of screening?  CT screening for lung cancer isn't perfect. It can show an abnormal result when it turns out there wasn't any cancer. This is called a false-positive result. This means you may need more tests to make sure you don't have cancer. These tests can be harmful and cause a lot of worry.  These tests may include more CT scans and invasive testing like a lung biopsy. In a biopsy, the doctor takes a sample of tissue from inside your lung so it can be looked at under a microscope. A biopsy is the only way to tell if you have lung cancer. If the biopsy finds cancer, you and your doctor will have to decide how or whether to treat it.  Some lung cancers found on CT scans are harmless and would not have caused a problem if they had not been found through screening. But because doctors can't tell which ones will turn out to be harmless, most will be treated. This means that you may get treatment--including surgery, radiation, or chemotherapy--that you don't need.  There is a risk of damage to  cells or tissue from being exposed to radiation, including the small amounts used in CTs, X-rays, and other medical tests. Over time, exposure to radiation may cause cancer and other health problems. But in most cases, the risk of getting cancer from being exposed to small amounts of radiation is low. It's not a reason to avoid these tests for most people.  What are the benefits of screening?  Your scan may be normal (negative).  For some people who are at higher risk, screening lowers the chance of dying of lung cancer. How much and how long you smoked helps to determine your risk level. Screening can find some cancers early, when treatment may be more likely to work.  What happens after screening?  The results of your  CT scan will be sent to your doctor. Someone from your care team will explain the results of your scan and answer any questions you may have. If you need any follow-up, he or she will help you understand what to do next.  After a lung cancer screening, you can go back to your usual activities right away.  A lung cancer screening test can't tell if you have lung cancer. If your results are positive, your doctor can't tell whether an abnormal finding is a harmless nodule, cancer, or something else without doing more tests.  What can you do to help prevent lung cancer?  Some lung cancers can't be prevented. But if you smoke, quitting smoking is the best step you can take to prevent lung cancer. If you want to quit, your doctor can recommend medicines or other ways to help.  Follow-up care is a key part of your treatment and safety. Be sure to make and go to all appointments, and call your doctor if you are having problems. It's also a good idea to know your test results and keep a list of the medicines you take.  Where can you learn more?  Go to https://www.bennett.info/ and enter Q940 to learn more about "Learning About Lung Cancer Screening."  Current as of: October 25, 2023Content Version: 14.0   2006-2024 Healthwise, Incorporated.   Care instructions adapted under license by [facility]. If you have questions about a medical condition or this instruction, always ask your healthcare professional. [place] any warranty or liability for your use of this information.           A Healthy Heart: Care Instructions  Overview     Coronary artery disease, also called heart disease, occurs when a substance called plaque builds up in the vessels that supply oxygen-rich blood to your heart muscle. This can narrow the blood vessels and reduce blood flow. A heart attack happens when blood flow is completely blocked. A high-fat diet, smoking, and other factors increase the risk  of heart disease.  Your doctor has found that you have a chance of having heart disease. A heart-healthy lifestyle can help keep your heart healthy and prevent heart disease. This lifestyle includes eating healthy, being active, staying at a weight that's healthy for you, and not smoking or using tobacco. It also includes taking medicines as directed, managing other health conditions, and trying to get a healthy amount of sleep.  Follow-up care is a key part of your treatment and safety. Be sure to make and go to all appointments, and call your doctor if you are having problems. It's also a good idea to know your test results and keep a list of the medicines you take.  How can you care for yourself at home?  Diet   Use less salt when you cook and eat. This helps lower your blood pressure. Taste food before salting. Add only a little salt when you think you need it. With time, your taste buds will adjust to less salt.    Eat fewer snack items, fast foods, canned soups, and other high-salt, high-fat, processed foods.    Read food labels and try to avoid saturated and trans fats. They increase your risk of heart disease by raising cholesterol levels.    Limit the amount of solid fat--butter, margarine, and shortening--you eat. Use olive, peanut, or canola oil when you cook. Bake, broil, and steam foods instead of frying them.    Eat a variety of fruit and vegetables every day. Dark green, deep orange, red, or yellow fruits and vegetables are especially good for you. Examples include spinach, carrots, peaches, and berries.    Foods high in fiber can reduce your cholesterol and provide important vitamins and minerals. High-fiber foods include whole-grain cereals and breads, oatmeal, beans, brown rice, citrus fruits, and apples.    Eat lean proteins. Heart-healthy proteins include seafood, lean meats and poultry, eggs, beans, peas, nuts, seeds, and soy products.    Limit drinks and foods with added sugar. These  include candy, desserts, and soda pop.   Heart-healthy lifestyle   If your doctor recommends it, get more exercise. For many people, walking is a good choice. Or you may want to swim, bike, or do other activities. Bit by bit, increase the time you're active every day. Try for at least 30 minutes on most days of the week.    Try to quit or cut back on using tobacco and other nicotine products. This includes smoking and vaping. If you need help quitting, talk to your doctor about stop-smoking programs and medicines. These can increase your chances of quitting for good. Quitting is one of the most important things you can do to protect your heart. It is never too late to quit. Try to avoid secondhand smoke too.    Stay at a weight that's healthy for you. Talk to your doctor if you need help losing weight.    Try to get 7 to 9 hours of sleep each night.    Limit alcohol to 2 drinks a day for men and 1 drink a day for women. Too much alcohol can cause health problems.    Manage other health problems such as diabetes, high blood pressure, and high cholesterol. If you think you may have a problem with alcohol or drug use, talk to your doctor.   Medicines   Take your medicines exactly as prescribed. Call your doctor if you think you are having a problem with your medicine.    If your doctor recommends aspirin, take the amount directed each day. Make sure you take aspirin and not another kind of pain reliever, such as acetaminophen (Tylenol).   When should you call for help?   Call 911 if you have symptoms of a heart attack. These may include:   Chest pain or pressure, or a strange feeling in the chest.    Sweating.    Shortness of breath.    Pain, pressure, or a strange feeling in the back, neck, jaw, or upper belly or in one or both shoulders or arms.    Lightheadedness or sudden weakness.    A fast or irregular heartbeat.   After you call 911, the operator  may tell you to chew 1 adult-strength or 2 to 4  low-dose aspirin. Wait for an ambulance. Do not try to drive yourself.  Watch closely for changes in your health, and be sure to contact your doctor if you have any problems.  Where can you learn more?  Go to [employer] and enter F075 to learn more about "A Healthy Heart: Care Instructions."  Current as of: June 24, 2023Content Version: 14.0   2006-2024 Healthwise, Incorporated.   Care instructions adapted under license by [facility]. If you have questions about a medical condition or this instruction, always ask your healthcare professional. Healthwise, Incorporated disclaims any warranty or liability for your use of this information.      Personalized Preventive Plan for Liahna Salido - 09/20/2022  Medicare offers a range of preventive health benefits. Some of the tests and screenings are paid in full while other may be subject to a deductible, co-insurance, and/or copay.    Some of these benefits include a comprehensive review of your medical history including lifestyle, illnesses that may run in your family, and various assessments and screenings as appropriate.    After reviewing your medical record and screening and assessments performed today your provider may have ordered immunizations, labs, imaging, and/or referrals for you.  A list of these orders (if applicable) as well as your Preventive Care list are included within your After Visit Summary for your review.    Other Preventive Recommendations:    A preventive eye exam performed by an eye specialist is recommended every 1-2 years to screen for glaucoma; cataracts, macular degeneration, and other eye disorders.  A preventive dental visit is recommended every 6 months.  Try to get at least 150 minutes of exercise per week or 10,000 steps per day on a pedometer .  Order or download the FREE "Exercise & Physical Activity: Your Everyday Guide" from [employer]. Call 331-513-8914 or search [employer] online.  You need 1200-1500 mg of calcium and 1000-2000 IU of vitamin D per day. It is possible to meet your calcium requirement with diet alone, but a vitamin D supplement is usually necessary to meet this goal.  When exposed to the sun, use a sunscreen that protects against both UVA and UVB radiation with an SPF of 30 or greater. Reapply every 2 to 3 hours or after sweating, drying off with a towel, or swimming.  Always wear a seat belt when traveling in a car. Always wear a helmet when riding a bicycle or motorcycle.

## 2022-09-21 NOTE — ACP (Advance Care Planning) (Addendum)
Advance Care Planning   Ambulatory ACP Specialist Patient Outreach    Date:  09/21/2022; 09/30/2023    ACP Specialist:  Guadlupe Spanish, LCSW    Outreach call to patient in follow-up to ACP Specialist referral from:Li, Vilma Prader, MD    [x]  PCP  []  Provider   []  Ambulatory Care Management []  Other     For:                  [x]  Advance Directive Assistance              []  Complete Portable DNR order              []  Complete POST/POLST/MOST              []  Code Status Discussion             []  Discuss Goals of Care             []  Early ACP Decision-Making              []  Other (Specify)    Date Referral Received:09/21/2022    Next Step:   []  ACP scheduled conversation  []  Outreach again in one week               []  Email / Mail ACP Advertising copywriter / Mail Advance Directive   []  Closing referral.  Routing closure to referring provider/staff and to Lexicographer.    []  Closure letter mailed to patient with invitation to contact ACP Specialist if / when ready.   [x]  Other (Specify here):  Pt will be provided time to return telephone calls to set up an ACP appointment.  If patient has not called back next week, this referral will be closed.        [x]  At this time, Healthcare Decision Maker Is:        []  Primary agent named in scanned advance directive.    []  Legal Next of Kin.     [x]  Unable to determine legal decision maker at this time.         Outreaches:         []  1st -  Date:  09/21/2022               Intervention:  []  Spoke with Patient   [x]  Left Voice mail []  Email / Mail    []  MyChart  []  Other (Specify) :     Outcomes: ACP Specialist made an outreach call to patient's mobile/home number listed in the medical record to offer an Advance Care Planning appointment.  Pt did not answer telephone call and voicemail message was left requesting a returned telephone call.  ACP Specialist will outreach to patient again next week to offer ACP appointment if patient has not returned call prior to the time of  the 2nd outreach call.     [x]  2nd -  Date:  09/30/2022               Intervention:  []  Spoke with Patient   [x]  Left Voice mail []  Email / Mail    []  MyChart  []  Other (Specify) :     Outcomes: ACP Specialist made a second outreach telephone call to patient's mobile/home number listed in the medical record to offer patient an Advance Care Planning appointment.  Pt did not answer telephone call and voicemail message was left providing patient with contact information to call back  to obtain an ACP appointment.  Pt will be provided time to return call to set up appointment.  If there is no response from patient next week, this referral will be closed.     Thank you for this referral.    [place] Parma. Bettey Costa, LISW-CP  Advance Care Planning Specialist  [facility] [employer]  [phone]

## 2022-09-26 ENCOUNTER — Ambulatory Visit
Admit: 2022-09-26 | Discharge: 2022-09-30 | Payer: MEDICARE | Primary: Student in an Organized Health Care Education/Training Program

## 2022-09-26 ENCOUNTER — Encounter
Admit: 2022-09-26 | Discharge: 2022-09-26 | Payer: MEDICARE | Attending: Physician Assistant | Primary: Student in an Organized Health Care Education/Training Program

## 2022-09-26 DIAGNOSIS — M5442 Lumbago with sciatica, left side: Secondary | ICD-10-CM

## 2022-09-26 NOTE — Progress Notes (Unsigned)
1. Have you been to the ER, urgent care clinic since your last visit?  Hospitalized since your last visit?  No     2. Have you seen or consulted any other health care providers outside of the [facility] since your last visit?  Include any pap smears or colon screening.   No    Chief Complaint   Patient presents with    Lower Back Pain     Chronic lower back pain, bilateral hip pain that radiates down the right and left thighs to her knees. She has not had any treatment in the last 12 months. She has newly relocated to [place] from [place].

## 2022-10-03 NOTE — Progress Notes (Cosign Needed)
Linda Hull (DOB: [birth year]) is a 67 y.o. female patient here for evaluation of the following chief complaint(s):  Lower Back Pain (Chronic lower back pain, bilateral hip pain that radiates down the right and left thighs to her knees. She has not had any treatment in the last 12 months. She has newly relocated to [place] from [place]. )         ASSESSMENT/PLAN:  Below is the assessment and plan developed based on review of pertinent history, physical exam, labs, studies, and medications.    1. Chronic bilateral low back pain with bilateral sciatica  -     XR LUMBAR SPINE (MIN 4 VIEWS); Future  -     External Referral To Physical Therapy  -     MRI LUMBAR SPINE WO CONTRAST; Future  2. Spinal stenosis of lumbar region with neurogenic claudication  -     External Referral To Physical Therapy  -     MRI LUMBAR SPINE WO CONTRAST; Future      The patient's radiologic findings have been reviewed with her in detail today.  She has longstanding history of chronic low back pain with bilateral lower extremity radiculopathy.  She has not had any recent conservative management.  I would like for her to start with formal course of outpatient physical therapy.  She will continue with over-the-counter medications.  She will obtain MRI of the lumbar spine.  Will see her back for follow-up visit after her MRI and PT are complete.    Return for MRI Follow Up, PT Follow Up.       SUBJECTIVE/OBJECTIVE:  Linda Hull (DOB: 02/19/1956) is a 67 y.o. female who presents today for the following:  Chief Complaint   Patient presents with    Lower Back Pain     Chronic lower back pain, bilateral hip pain that radiates down the right and left thighs to her knees. She has not had any treatment in the last 12 months. She has newly relocated to [place] from [place].         HPI   Linda Hull presents today as a new patient to the practice.  She states that she just relocated to the [place] area in November and is here to establish.  She has a  longstanding history of chronic low back pain since 2019.  She denies any injury prior to the onset of her symptoms.  Her pain is constant in nature and is worsened progressively over the past 2 years.  She has pain constantly in bilateral buttocks and along the lateral aspect of legs to the knees.  She has occasional numbness and tingling along the legs to the ankles.  She has sensation of subjective weakness in her legs bilaterally.  She is able to ambulate less than a block at this point.  She denies any changes in bowel or bladder control.  She has known hip and knee arthritis.  Her last MRI is from 2020.  No recent treatment for her back.  She does ambulate with a rollator today.  She is using Advil and Tylenol for her back symptoms.    IMAGING:  Xray Result (most recent):  XR LUMBAR SPINE (MIN 4 VIEWS) 09/26/2022    Narrative  AP, lateral, flexion, and extension films of the lumbar spine reveal no evidence of acute fracture or lytic lesion.  There is well-maintained lumbar lordosis.  There is mild degenerative scoliosis noted with moderate multilevel disc degeneration and spondylosis noted throughout the lumbar spine.  There is mild retrolisthesis at L1-2 and L2-3 with mild anterolisthesis at L4-5.  No significant movement with flexion or extension views.       MRI Result (most recent):  No results found for this or any previous visit from the past 3650 days.       Allergies   Allergen Reactions    Latex Swelling    Hydromorphone Nausea And Vomiting    Codeine Nausea And Vomiting and Dizziness or Vertigo       Current Outpatient Medications   Medication Sig Dispense Refill    furosemide (LASIX) 20 MG tablet Take 1 tablet by mouth 2 times daily as needed (leg swelling) 60 tablet 5    DULoxetine (CYMBALTA) 60 MG extended release capsule Take 1 capsule by mouth daily 90 capsule 3    fluticasone (FLONASE ALLERGY RELIEF) 50 MCG/ACT nasal spray 1 spray by Nasal route in the morning and at bedtime 16 g 11     magnesium chloride (MAG64) 64 MG TBEC extended release tablet Take 1 tablet by mouth daily (with breakfast)      amphetamine-dextroamphetamine (ADDERALL) 20 MG tablet Take 1 tablet by mouth daily.      albuterol sulfate HFA (PROVENTIL;VENTOLIN;PROAIR) 108 (90 Base) MCG/ACT inhaler Inhale 2 puffs into the lungs 4 times daily 18 g 5    traZODone (DESYREL) 150 MG tablet Take 1-2 tablets by mouth nightly 60 tablet 5    pantoprazole (PROTONIX) 40 MG tablet Take 1 tablet by mouth daily 90 tablet 1     No current facility-administered medications for this visit.        Past Medical History:   Diagnosis Date    Duodenal ulcer 07/01/2021    Gastric ulcer 07/01/2021        No past surgical history on file.    Family History   Problem Relation Age of Onset    Lung Cancer Mother         non smoker    Hypertension Mother     Thalassemia Mother     High Blood Pressure Father     High Cholesterol Father     High Cholesterol Brother     Diabetes Brother     High Cholesterol Brother     Diabetes Brother         Social History     Tobacco Use    Smoking status: Every Day     Current packs/day: 0.50     Average packs/day: 0.5 packs/day for 41.3 years (20.6 ttl pk-yrs)     Types: Cigarettes     Start date: 06/22/1981    Smokeless tobacco: Never   Substance Use Topics    Alcohol use: Never        Review of Systems   Constitutional: Negative.    HENT: Negative.     Eyes: Negative.    Respiratory: Negative.     Cardiovascular: Negative.    Gastrointestinal: Negative.    Endocrine: Negative.    Genitourinary: Negative.    Musculoskeletal:  Positive for back pain.   Skin: Negative.    Allergic/Immunologic: Negative.    Hematological: Negative.    Psychiatric/Behavioral: Negative.          Failed to redirect to the Timeline version of the REVFS SmartLink.        Vitals:  Ht 1.626 m (5\' 4" )   Wt 54.4 kg (120 lb)   BMI 20.60 kg/m    Body mass index is 20.6 kg/m.  Physical Exam    Neurologic  Sensory  Light Touch - Intact -  Globally.  Overall Assessment of Muscle Strength and Tone reveals  Lower Extremities - Right Iliopsoas - 5/5. Left Iliopsoas - 5/5. Right Tibialis Anterior - 5/5. Left Tibialis Anterior - 5/5. Right Gastroc-Soleus - 5/5. Left Gastroc-Soleus - 5/5. Right EHL - 5/5. Left EHL - 5/5.  General Assessment of Reflexes  Right Ankle - Clonus is not present. Left Ankle - Clonus is not present.  Reflexes (Dermatomes)  2/2 Normal - Left Achilles (L5-S2), Left Knee (L2-4), Right Achilles (L5-S2) and Right Knee (L2-4).    Musculoskeletal  Global Assessment  Examination of related systems reveals - well-developed, well-nourished, in no acute distress, alert and oriented x 3. Gait and Station - normal gait and station and normal posture. Right Lower Extremity - normal strength and tone, normal range of motion without pain and no instability, subluxation or laxity. Left Lower Extremity - normal strength and tone, normal range of motion without pain and no instability, subluxation or laxity.  Spine/Ribs/Pelvis  Cervical Spine - Examination of the cervical spine reveals - no tenderness to palpation, no pain, no swelling, edema or erythema, normal cervical spine movements and normal sensation. Thoracic (Dorsal) Spine - Examination of the thoracic spine reveals - no tenderness over thoracic vertebrae, no pain, normal sensation and normal thoracic spine movements. Lumbosacral Spine - Examination of the lumbosacral spine reveals - no known fractures or deformities. Inspection and Palpation - Tenderness - moderate. Assessment of pain reveals the following findings - The pain is characterized as - moderate. Location - pain refers to lower back bilaterally. ROJM - Trunk Extension - 15 degrees. Lumbar Spine Flexion - 35 . Lumbosacral Spine - Functional Testing - Babinski Test negative, Prone Knee Bending Test negative, Slump Test negative, Straight Leg Raising Test negative.      Dr. Raylene Miyamoto was available for immediate consult during  this encounter.  An electronic signature was used to authenticate this note.  -- Francesco Sor, PA

## 2022-11-09 NOTE — Telephone Encounter (Signed)
Pt called re: refill of duloxetine; pt informed by B. Ladona Ridgel that refills of this RX should still be available at pt pharmacy in chart-- [facility] at [address].

## 2022-11-29 ENCOUNTER — Encounter

## 2022-11-29 ENCOUNTER — Ambulatory Visit
Admit: 2022-11-29 | Discharge: 2022-11-29 | Payer: MEDICARE | Attending: Student in an Organized Health Care Education/Training Program | Primary: Student in an Organized Health Care Education/Training Program

## 2022-11-29 DIAGNOSIS — F900 Attention-deficit hyperactivity disorder, predominantly inattentive type: Secondary | ICD-10-CM

## 2022-11-29 DIAGNOSIS — Z5181 Encounter for therapeutic drug level monitoring: Secondary | ICD-10-CM

## 2022-11-29 MED ORDER — PREGABALIN 25 MG PO CAPS
25 MG | ORAL_CAPSULE | Freq: Two times a day (BID) | ORAL | 5 refills | Status: AC
Start: 2022-11-29 — End: 2023-05-28

## 2022-11-29 MED ORDER — AMPHETAMINE-DEXTROAMPHETAMINE 5 MG PO TABS
5 MG | ORAL_TABLET | Freq: Two times a day (BID) | ORAL | 0 refills | Status: AC
Start: 2022-11-29 — End: 2022-12-29

## 2022-11-29 MED ORDER — PANTOPRAZOLE SODIUM 40 MG PO TBEC
40 MG | ORAL_TABLET | Freq: Every day | ORAL | 0 refills | Status: AC
Start: 2022-11-29 — End: 2023-03-30

## 2022-11-29 NOTE — Progress Notes (Signed)
Linda Hull (DOB:  1956/03/22) is a 67 y.o. female, Established patient, here for evaluation of the following chief complaint(s):  Follow-up        Subjective   SUBJECTIVE/OBJECTIVE:  Patient presents today for follow-up for the following:    Chronic problems:  Chronic back, hip and neck pain: has upcoming appointment with Ortho. Wants MRI. Discussed PT. Open to having this done. She does use a walker to get around. XR showed scoliosis, lordosis and degenerative disc disease. Has MRI upcoming.  Allergies/congestion: On flonase  Mild persistent asthma - On advair BID and has albuterol PRN - has barely needed it so she has cut down on Advair  ADHD: States she is on Adderall 10 mg daily - she does not take this every day. Wants a refill of this medication. She did bring bottle with her today.  Bipolar II: has not seen psychiatrist in 11-12 years. States this is well controlled. I did note she is on Cymbalta 60 mg, but no medications for bipolar  GERD with history of duodenal/gastric ulcers: Patient is on protonix 40 mg daily  Leg swelling: She has lasix that she uses as needed - 20 mg PRN - No known history of heart failure. Has had issues with swelling since age 4.    Preventative care:  Health Maintenance Due   Topic Date Due    Breast cancer screen  Never done    Colorectal Cancer Screen  Never done    Lung Cancer Screening &/or Counseling  Never done    DEXA (modify frequency per FRAX score)  Never done      Pap smear: Been >5 years  Colonoscopy: 14-15 years ago. Discussed repeat - patient opted for Cologuard  Patient needs to schedule CT lung, Mammogram, and DEXA  Discussed vaccinations - refused    Past Medical History:   Diagnosis Date    Duodenal ulcer 07/01/2021    Gastric ulcer 07/01/2021     History reviewed. No pertinent surgical history.   Family History   Problem Relation Age of Onset    Lung Cancer Mother         non smoker    Hypertension Mother     Thalassemia Mother     High Blood Pressure Father      High Cholesterol Father     High Cholesterol Brother     Diabetes Brother     High Cholesterol Brother     Diabetes Brother      Social History     Socioeconomic History    Marital status: Single     Spouse name: Not on file    Number of children: Not on file    Years of education: Not on file    Highest education level: Not on file   Occupational History    Not on file   Tobacco Use    Smoking status: Every Day     Current packs/day: 0.50     Average packs/day: 0.5 packs/day for 41.4 years (20.7 ttl pk-yrs)     Types: Cigarettes     Start date: 06/22/1981    Smokeless tobacco: Never   Substance and Sexual Activity    Alcohol use: Never    Drug use: Never    Sexual activity: Not on file   Other Topics Concern    Not on file   Social History Narrative    Not on file     Social Determinants of Health     Financial  Resource Strain: Low Risk  (07/21/2022)    Overall Financial Resource Strain (CARDIA)     Difficulty of Paying Living Expenses: Not hard at all   Recent Concern: Financial Resource Strain - High Risk (06/22/2022)    Overall Financial Resource Strain (CARDIA)     Difficulty of Paying Living Expenses: Very hard   Food Insecurity: No Food Insecurity (07/21/2022)    Hunger Vital Sign     Worried About Running Out of Food in the Last Year: Never true     Ran Out of Food in the Last Year: Never true   Recent Concern: Food Insecurity - Food Insecurity Present (06/22/2022)    Hunger Vital Sign     Worried About Running Out of Food in the Last Year: Often true     Ran Out of Food in the Last Year: Often true   Transportation Needs: Unknown (07/21/2022)    PRAPARE - Therapist, art (Medical): Not on file     Lack of Transportation (Non-Medical): No   Physical Activity: Inactive (09/20/2022)    Exercise Vital Sign     Days of Exercise per Week: 0 days     Minutes of Exercise per Session: 0 min   Stress: Not on file   Social Connections: Not on file   Intimate Partner Violence: Not on file   Housing  Stability: Unknown (07/21/2022)    Housing Stability Vital Sign     Unable to Pay for Housing in the Last Year: Not on file     Number of Places Lived in the Last Year: Not on file     Unstable Housing in the Last Year: No       Current Outpatient Medications:     pantoprazole (PROTONIX) 40 MG tablet, Take 1 tablet by mouth daily, Disp: 90 tablet, Rfl: 0    pregabalin (LYRICA) 25 MG capsule, Take 1 capsule by mouth 2 times daily for 180 days. Max Daily Amount: 50 mg, Disp: 60 capsule, Rfl: 5    amphetamine-dextroamphetamine (ADDERALL, 5MG ,) 5 MG tablet, Take 1 tablet by mouth 2 times daily for 30 days. Max Daily Amount: 10 mg, Disp: 60 tablet, Rfl: 0    furosemide (LASIX) 20 MG tablet, Take 1 tablet by mouth 2 times daily as needed (leg swelling), Disp: 60 tablet, Rfl: 5    DULoxetine (CYMBALTA) 60 MG extended release capsule, Take 1 capsule by mouth daily, Disp: 90 capsule, Rfl: 3    fluticasone (FLONASE ALLERGY RELIEF) 50 MCG/ACT nasal spray, 1 spray by Nasal route in the morning and at bedtime, Disp: 16 g, Rfl: 11    albuterol sulfate HFA (PROVENTIL;VENTOLIN;PROAIR) 108 (90 Base) MCG/ACT inhaler, Inhale 2 puffs into the lungs 4 times daily, Disp: 18 g, Rfl: 5    traZODone (DESYREL) 150 MG tablet, Take 1-2 tablets by mouth nightly, Disp: 60 tablet, Rfl: 5    magnesium chloride (MAG64) 64 MG TBEC extended release tablet, Take 1 tablet by mouth daily (with breakfast), Disp: , Rfl:   Allergies   Allergen Reactions    Latex Swelling    Hydromorphone Nausea And Vomiting    Codeine Nausea And Vomiting and Dizziness or Vertigo       Review of Systems   Constitutional:  Negative for chills and fever.   HENT:  Negative for congestion, ear pain and postnasal drip.    Respiratory:  Negative for cough and shortness of breath.    Cardiovascular:  Negative for chest  pain and palpitations.   Gastrointestinal:  Negative for abdominal pain, blood in stool, constipation, diarrhea, nausea and vomiting.   Musculoskeletal:  Positive for  arthralgias, back pain, gait problem, neck pain and neck stiffness. Negative for myalgias.   Neurological:  Negative for dizziness, numbness and headaches.   Psychiatric/Behavioral:  Positive for decreased concentration and sleep disturbance. Negative for dysphoric mood. The patient is not nervous/anxious.           Objective   Vitals:    11/29/22 1601   BP: 120/80   Site: Left Upper Arm   Position: Sitting   Pulse: (!) 105   Temp: 99.2 F (37.3 C)   TempSrc: Oral   SpO2: 95%   Weight: 61.9 kg (136 lb 6.4 oz)      Physical Exam  Vitals and nursing note reviewed.   Constitutional:       General: She is not in acute distress.     Appearance: Normal appearance. She is not ill-appearing.   HENT:      Mouth/Throat:      Mouth: Mucous membranes are moist.      Pharynx: Oropharynx is clear.   Cardiovascular:      Rate and Rhythm: Regular rhythm.      Heart sounds: No murmur heard.  Pulmonary:      Effort: Pulmonary effort is normal.      Breath sounds: Normal breath sounds. No wheezing.   Abdominal:      General: Bowel sounds are normal. There is no distension.      Palpations: Abdomen is soft.      Tenderness: There is no abdominal tenderness.   Musculoskeletal:      Right lower leg: No edema.      Left lower leg: No edema.   Neurological:      Mental Status: She is alert.   Psychiatric:         Mood and Affect: Mood normal.         Behavior: Behavior normal.                Assessment & Plan   ASSESSMENT/PLAN:  1. Mild persistent asthma without complication  2. Attention deficit hyperactivity disorder (ADHD), predominantly inattentive type  -     [facility] - Daranciang, Colin Mulders, PsyD, Neuropsychology, [place] ([address])  -     amphetamine-dextroamphetamine (ADDERALL, 5MG ,) 5 MG tablet; Take 1 tablet by mouth 2 times daily for 30 days. Max Daily Amount: 10 mg, Disp-60 tablet, R-0Normal  -     11-Drug Screen, Urine w Rflx Confirm; Future  3. Chronic fatigue  -     pregabalin (LYRICA) 25 MG capsule; Take 1 capsule by mouth 2  times daily for 180 days. Max Daily Amount: 50 mg, Disp-60 capsule, R-5Normal  4. Bipolar affective disorder, remission status unspecified (HCC)  -     [facility] - Daranciang, Colin Mulders, PsyD, Neuropsychology, [place] ([address])  5. Primary insomnia  6. Gastroesophageal reflux disease without esophagitis  -     pantoprazole (PROTONIX) 40 MG tablet; Take 1 tablet by mouth daily, Disp-90 tablet, R-0Normal  7. History of gastric ulcer  8. Iron deficiency anemia, unspecified iron deficiency anemia type  9. 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 Chronic bilateral low back pain without sciatica  -     pregabalin (LYRICA) 25 MG capsule; Take 1 capsule by mouth 2 times daily for 180 days. Max Daily Amount: 50 mg, Disp-60 capsule, R-5Normal  11. Bilateral hip joint arthritis  -  pregabalin (LYRICA) 25 MG capsule; Take 1 capsule by mouth 2 times daily for 180 days. Max Daily Amount: 50 mg, Disp-60 capsule, R-5Normal  12. Edema of both lower extremities  13. Elevated cholesterol  14. Vitamin B12 deficiency  15. Vitamin D deficiency  16. Personal history of tobacco use  17. Screening for colorectal cancer  18. Medication monitoring encounter  -     11-Drug Screen, Urine w Rflx Confirm; Future         Patient presents today for follow-up for the above issues. See plan below:  ADHD: Chronic and stable. Referral placed to behavioral health/psychiatry. But patient did not want to see. Wants me to take over medications. Referral placed to neuropsychology for testing. Discussed at length today ADHD medication and its risk, such as to heart, addiction, blood pressure, etc. Patient understands risks and benefits and would like to proceed. Did check PMP which is appropriate its been over a month since patient has had this medication refilled.  We will also completed a controlled med contract in the room after which is signed copy was given to the patient. A random urine drug screen was also performed.  Chronic fatigue: Uncontrolled. Will trial low dose  lyrica to see if there is benefit. Will plan to check vitamin levels at next visit with routine labs.  Bipolar disorder: Chronic and stable per patient. Referral placed to neuropsychology. Follow-up PRN.  Insomnia: Patient is on trazodone 150-300 mg nightly. Continue for now.  GERD with history of ulcers: Chronic and stable. Continue protonix for 40 mg daily. Discussed trying to switch to EOD dosing given risk for depression and decreased bone density with prolonged PPI use  See #7 above'  Mild persistent asthma: Taking albuterol as needed. 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 Referral placed to PT. Patient established with orthopedic surgery. Lyrica may help with this. Follow-up PRN.  Chronic hip pain/arthritis: Uncontrolled. PT, ortho and lyrica per above.  Edema: Chronic and stable. Continue lasix as needed   Elevated Cholesterol: Chronic and uncontrolled. Patient does not want to be on medication. Diet and lifestyle re-inforced - consider fish oil.  Vitamin B12 deficiency: Chronic and stable. Recheck levels at next visit  Vitamin D deficiency: Chronic and stable - recheck levels at next visit  Smoking: Patient still using cigarettes. Counseled briefly, but patient did not want to quit.   Discussed colon cancer screening with the patient as patient is due for this. Patient opted to have cologuard done.  Encouraged patient to complete this  UDS per above.            Return in about 2 months (around 01/30/2023) for Follow-up ADHD, labs.         An electronic signature was used to authenticate this note.    --Unk Lightning, MD

## 2022-11-29 NOTE — Progress Notes (Signed)
[facility]    Chief Complaint   Patient presents with    Follow-up         "Have you been to the ER, urgent care clinic since your last visit?  Hospitalized since your last visit?"    NO    "Have you seen or consulted any other health care providers outside of [facility] since your last visit?"    NO    Have you had a mammogram?"   NO    No breast cancer screening on file         "Have you had a colorectal cancer screening such as a colonoscopy/FIT/Cologuard?    NO    No colonoscopy on file  No cologuard on file  No FIT/FOBT on file   No flexible sigmoidoscopy on file         Click Here for Release of Records Request   AVS  education, follow up, and recommendations provided and addressed with patient. Interpreter services used to advise patient No

## 2022-12-05 LAB — 11-DRUG SCREEN, URINE W RFLX CONFIRM
Barbiturates, Urine: NEGATIVE ng/mL
Benzodiazepines, Urine: NEGATIVE ng/mL
Cannabinoids, Urine: NEGATIVE ng/mL
Cocaine, Urine: NEGATIVE ng/mL
MDMA, Urine: NEGATIVE ng/mL
Methadone, Urine: NEGATIVE ng/mL
Methaqualone Screen, Urine: NEGATIVE ng/mL
Opiates, Urine: NEGATIVE ng/mL
Phencyclidine, Urine: NEGATIVE ng/mL
Propoxyphene, Urine: NEGATIVE ng/mL

## 2022-12-05 LAB — AMPHETAMINE CONFIRMATION URINE REFLEX: Amphetamine Urine Confirm: NEGATIVE

## 2022-12-28 ENCOUNTER — Encounter

## 2022-12-28 MED ORDER — TRAZODONE HCL 150 MG PO TABS
150 MG | ORAL_TABLET | Freq: Every evening | ORAL | 5 refills | Status: AC
Start: 2022-12-28 — End: 2023-02-22

## 2022-12-28 NOTE — Telephone Encounter (Signed)
Medication refilled.    Linda Gilland, MD

## 2022-12-28 NOTE — Telephone Encounter (Signed)
Last appointment: 11/29/22  Next appointment: 01/31/23  Previous refill encounter(s): 06/22/22 #60 with 5 refills    Requested Prescriptions     Pending Prescriptions Disp Refills    traZODone (DESYREL) 150 MG tablet 60 tablet 5     Sig: Take 1-2 tablets by mouth nightly         For Pharmacy Admin Tracking Only    Program: Medication Refill  CPA in place:    Recommendation Provided To:   Intervention Detail: New Rx: 1, reason: Patient Preference  Intervention Accepted By:   Eugenia Pancoast Closed?:    Time Spent (min): 5

## 2023-01-25 ENCOUNTER — Encounter

## 2023-01-25 MED ORDER — FUROSEMIDE 20 MG PO TABS
20 | ORAL_TABLET | Freq: Two times a day (BID) | ORAL | 5 refills | Status: AC | PRN
Start: 2023-01-25 — End: ?

## 2023-01-25 NOTE — Telephone Encounter (Signed)
 Medication refilled.    Unk Lightning, MD

## 2023-01-25 NOTE — Telephone Encounter (Signed)
 Last appointment: 11/29/2022 MD Lucillie   Next appointment: 01/31/2023 MD Lucillie   Previous refill encounter(s):   07/21/2022 Lasix  #60 with 5 refills.     For Pharmacy Admin Tracking Only    Program: Medication Refill  Intervention Detail: New Rx: 1, reason: Patient Preference  Time Spent (min): 5  Requested Prescriptions     Pending Prescriptions Disp Refills    furosemide  (LASIX ) 20 MG tablet 60 tablet 0     Sig: Take 1 tablet by mouth 2 times daily as needed (leg swelling)

## 2023-01-31 ENCOUNTER — Ambulatory Visit
Admit: 2023-01-31 | Payer: MEDICARE | Attending: Student in an Organized Health Care Education/Training Program | Primary: Student in an Organized Health Care Education/Training Program

## 2023-01-31 ENCOUNTER — Encounter

## 2023-01-31 DIAGNOSIS — J4530 Mild persistent asthma, uncomplicated: Secondary | ICD-10-CM

## 2023-01-31 MED ORDER — AMPHETAMINE-DEXTROAMPHETAMINE 5 MG PO TABS
5 | ORAL_TABLET | Freq: Two times a day (BID) | ORAL | 0 refills | Status: DC
Start: 2023-01-31 — End: 2023-11-24

## 2023-01-31 MED ORDER — PREGABALIN 50 MG PO CAPS
50 | ORAL_CAPSULE | Freq: Two times a day (BID) | ORAL | 5 refills | Status: DC
Start: 2023-01-31 — End: 2023-11-24

## 2023-01-31 MED ORDER — ALBUTEROL SULFATE HFA 108 (90 BASE) MCG/ACT IN AERS
108 | Freq: Four times a day (QID) | RESPIRATORY_TRACT | 11 refills | Status: DC
Start: 2023-01-31 — End: 2023-12-04

## 2023-01-31 NOTE — Progress Notes (Signed)
 [id number]    Chief Complaint   Patient presents with    Follow-up       Vitals:    01/31/23 1410   BP: 127/81   Site: Left Upper Arm   Position: Sitting   Pulse: 92   Temp: 97.9 F (36.6 C)   TempSrc: Oral   SpO2: 96%   Weight: 61.3 kg (135 lb 3.2 oz)        Have you been to the ER, urgent care clinic since your last visit?  Hospitalized since your last visit?    NO    "Have you seen or consulted any other health care providers outside of [facility] since your last visit?"    NO    Have you had a mammogram?"   NO    No breast cancer screening on file         "Have you had a colorectal cancer screening such as a colonoscopy/FIT/Cologuard?    NO    No colonoscopy on file  No cologuard on file  No FIT/FOBT on file   No flexible sigmoidoscopy on file         Click Here for Release of Records Request   AVS  education, follow up, and recommendations provided and addressed with patient. Interpreter services used to advise patient No

## 2023-01-31 NOTE — Progress Notes (Signed)
 Linda Hull (DOB:  [birth year]) is a 67 y.o. female, Established patient, here for evaluation of the following chief complaint(s):  Follow-up        Subjective   SUBJECTIVE/OBJECTIVE:  Patient presents today for follow-up for the following:    Chronic problems:  Chronic back, hip and neck pain: has upcoming appointment with Ortho. Wants MRI. Discussed PT. Open to having this done. She does use a walker to get around. XR showed scoliosis, lordosis and degenerative disc disease. Has MRI upcoming.  Allergies/congestion: On flonase   Mild persistent asthma - On advair  BID and has albuterol  PRN - has barely needed it so she has cut down on Advair   ADHD: States she is on Adderall 10 mg daily - she does not take this every day. We started her on 5 mg BID. Will continue this.  Bipolar II: has not seen psychiatrist in 11-12 years. States this is well controlled. I did note she is on Cymbalta  60 mg, but no medications for bipolar  GERD with history of duodenal/gastric ulcers: Patient is on protonix  40 mg daily  Leg swelling: She has lasix  that she uses as needed - 20 mg PRN - No known history of heart failure. Has had issues with swelling since age 50.    Preventative care:  Health Maintenance Due   Topic Date Due    Breast cancer screen  Never done    Colorectal Cancer Screen  Never done    Lung Cancer Screening &/or Counseling  Never done    DEXA (modify frequency per FRAX score)  Never done      Pap smear: Been >5 years  Colonoscopy: 14-15 years ago. Discussed repeat - patient opted for Cologuard  Patient needs to schedule CT lung, Mammogram, and DEXA  Discussed vaccinations - refused    Past Medical History:   Diagnosis Date    Duodenal ulcer 07/01/2021    Gastric ulcer 07/01/2021     History reviewed. No pertinent surgical history.   Family History   Problem Relation Age of Onset    Lung Cancer Mother         non smoker    Hypertension Mother     Thalassemia Mother     High Blood Pressure Father     High Cholesterol  Father     High Cholesterol Brother     Diabetes Brother     High Cholesterol Brother     Diabetes Brother      Social History     Socioeconomic History    Marital status: Single     Spouse name: Not on file    Number of children: Not on file    Years of education: Not on file    Highest education level: Not on file   Occupational History    Not on file   Tobacco Use    Smoking status: Every Day     Current packs/day: 0.50     Average packs/day: 0.5 packs/day for 41.6 years (20.8 ttl pk-yrs)     Types: Cigarettes     Start date: 06/22/1981    Smokeless tobacco: Never   Substance and Sexual Activity    Alcohol use: Never    Drug use: Never    Sexual activity: Not on file   Other Topics Concern    Not on file   Social History Narrative    Not on file     Social Determinants of Health     Financial Resource Strain: Low  Risk  (07/21/2022)    Overall Financial Resource Strain (CARDIA)     Difficulty of Paying Living Expenses: Not hard at all   Recent Concern: Financial Resource Strain - High Risk (06/22/2022)    Overall Financial Resource Strain (CARDIA)     Difficulty of Paying Living Expenses: Very hard   Food Insecurity: No Food Insecurity (07/21/2022)    Hunger Vital Sign     Worried About Running Out of Food in the Last Year: Never true     Ran Out of Food in the Last Year: Never true   Recent Concern: Food Insecurity - Food Insecurity Present (06/22/2022)    Hunger Vital Sign     Worried About Running Out of Food in the Last Year: Often true     Ran Out of Food in the Last Year: Often true   Transportation Needs: Unknown (07/21/2022)    PRAPARE - Therapist, Art (Medical): Not on file     Lack of Transportation (Non-Medical): No   Physical Activity: Inactive (09/20/2022)    Exercise Vital Sign     Days of Exercise per Week: 0 days     Minutes of Exercise per Session: 0 min   Stress: Not on file   Social Connections: Not on file   Intimate Partner Violence: Not on file   Housing Stability: Unknown  (07/21/2022)    Housing Stability Vital Sign     Unable to Pay for Housing in the Last Year: Not on file     Number of Places Lived in the Last Year: Not on file     Unstable Housing in the Last Year: No       Current Outpatient Medications:     albuterol  sulfate HFA (PROVENTIL ;VENTOLIN ;PROAIR ) 108 (90 Base) MCG/ACT inhaler, Inhale 2 puffs into the lungs 4 times daily, Disp: 18 g, Rfl: 11    pregabalin  (LYRICA ) 50 MG capsule, Take 1 capsule by mouth 2 times daily for 180 days. Max Daily Amount: 100 mg, Disp: 60 capsule, Rfl: 5    amphetamine -dextroamphetamine  (ADDERALL, 5MG ,) 5 MG tablet, Take 1 tablet by mouth 2 times daily for 30 days. Max Daily Amount: 10 mg, Disp: 60 tablet, Rfl: 0    [START ON 03/02/2023] amphetamine -dextroamphetamine  (ADDERALL, 5MG ,) 5 MG tablet, Take 1 tablet by mouth 2 times daily for 30 days. Max Daily Amount: 10 mg, Disp: 60 tablet, Rfl: 0    furosemide  (LASIX ) 20 MG tablet, Take 1 tablet by mouth 2 times daily as needed (leg swelling), Disp: 60 tablet, Rfl: 5    traZODone  (DESYREL ) 150 MG tablet, Take 1-2 tablets by mouth nightly, Disp: 60 tablet, Rfl: 5    pantoprazole  (PROTONIX ) 40 MG tablet, Take 1 tablet by mouth daily, Disp: 90 tablet, Rfl: 0    DULoxetine  (CYMBALTA ) 60 MG extended release capsule, Take 1 capsule by mouth daily, Disp: 90 capsule, Rfl: 3    fluticasone  (FLONASE  ALLERGY RELIEF) 50 MCG/ACT nasal spray, 1 spray by Nasal route in the morning and at bedtime, Disp: 16 g, Rfl: 11    magnesium chloride (MAG64) 64 MG TBEC extended release tablet, Take 1 tablet by mouth daily (with breakfast), Disp: , Rfl:   Allergies   Allergen Reactions    Latex Swelling    Hydromorphone Nausea And Vomiting    Codeine Nausea And Vomiting and Dizziness or Vertigo       Review of Systems   Constitutional:  Negative for chills and fever.  HENT:  Negative for congestion, ear pain and postnasal drip.    Respiratory:  Negative for cough and shortness of breath.    Cardiovascular:  Negative for chest  pain and palpitations.   Gastrointestinal:  Negative for abdominal pain, blood in stool, constipation, diarrhea, nausea and vomiting.   Musculoskeletal:  Positive for arthralgias, back pain, gait problem, neck pain and neck stiffness. Negative for myalgias.   Neurological:  Negative for dizziness, numbness and headaches.   Psychiatric/Behavioral:  Positive for decreased concentration and sleep disturbance. Negative for dysphoric mood. The patient is not nervous/anxious.           Objective   Vitals:    01/31/23 1410   BP: 127/81   Site: Left Upper Arm   Position: Sitting   Pulse: 92   Temp: 97.9 F (36.6 C)   TempSrc: Oral   SpO2: 96%   Weight: 61.3 kg (135 lb 3.2 oz)      Physical Exam  Vitals and nursing note reviewed.   Constitutional:       General: She is not in acute distress.     Appearance: Normal appearance. She is not ill-appearing.   HENT:      Mouth/Throat:      Mouth: Mucous membranes are moist.      Pharynx: Oropharynx is clear.   Cardiovascular:      Rate and Rhythm: Regular rhythm.      Heart sounds: No murmur heard.  Pulmonary:      Effort: Pulmonary effort is normal.      Breath sounds: Normal breath sounds. No wheezing.   Abdominal:      General: Bowel sounds are normal. There is no distension.      Palpations: Abdomen is soft.      Tenderness: There is no abdominal tenderness.   Musculoskeletal:      Right lower leg: No edema.      Left lower leg: No edema.   Neurological:      Mental Status: She is alert.   Psychiatric:         Mood and Affect: Mood normal.         Behavior: Behavior normal.                Assessment & Plan   ASSESSMENT/PLAN:  1. Attention deficit hyperactivity disorder (ADHD), predominantly inattentive type  -     Basic Metabolic Panel; Future  -     amphetamine -dextroamphetamine  (ADDERALL, 5MG ,) 5 MG tablet; Take 1 tablet by mouth 2 times daily for 30 days. Max Daily Amount: 10 mg, Disp-60 tablet, R-0Normal  -     amphetamine -dextroamphetamine  (ADDERALL, 5MG ,) 5 MG tablet;  Take 1 tablet by mouth 2 times daily for 30 days. Max Daily Amount: 10 mg, Disp-60 tablet, R-0Print  2. Chronic fatigue  -     Basic Metabolic Panel; Future  -     pregabalin  (LYRICA ) 50 MG capsule; Take 1 capsule by mouth 2 times daily for 180 days. Max Daily Amount: 100 mg, Disp-60 capsule, R-5Normal  3. Primary insomnia  4. Bipolar affective disorder, remission status unspecified (HCC)  5. Gastroesophageal reflux disease without esophagitis  6. History of gastric ulcer  7. Mild persistent asthma without complication  -     albuterol  sulfate HFA (PROVENTIL ;VENTOLIN ;PROAIR ) 108 (90 Base) MCG/ACT inhaler; Inhale 2 puffs into the lungs 4 times daily, Disp-18 g, R-11Normal  8. 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  Chronic bilateral low back pain without sciatica  -     pregabalin  (LYRICA ) 50 MG capsule; Take 1 capsule by mouth 2 times daily for 180 days. Max Daily Amount: 100 mg, Disp-60 capsule, R-5Normal  10. Bilateral hip joint arthritis  -     pregabalin  (LYRICA ) 50 MG capsule; Take 1 capsule by mouth 2 times daily for 180 days. Max Daily Amount: 100 mg, Disp-60 capsule, R-5Normal  11. Edema of both lower extremities  12. Elevated cholesterol  -     Basic Metabolic Panel; Future  13. Vitamin B12 deficiency  -     CBC with Auto Differential; Future  -     Vitamin B12; Future  -     Basic Metabolic Panel; Future  14. Vitamin D deficiency  -     Vitamin D 25 Hydroxy; Future  -     Basic Metabolic Panel; Future  15. Personal history of tobacco use  16. Screening for colorectal cancer         Patient presents today for follow-up for the above issues. See plan below:  ADHD: Chronic and stable. Patient is on Adderall 5 mg BID. PDMP reviewed and consistent. UDS has been good.   Chronic fatigue: Uncontrolled. Will trial low dose lyrica  to see if there is benefit. Will plan to check vitamin levels  Bipolar disorder: Chronic and stable per patient. Referral placed to neuropsychology previously -  she has not scheduled. Follow-up PRN.  Insomnia: Patient is on trazodone  150-300 mg nightly. Continue for now.  GERD with history of ulcers: Chronic and stable. Continue protonix  for 40 mg daily. Discussed trying to switch to EOD dosing given risk for depression and decreased bone density with prolonged PPI use  See #7 above'  Mild persistent asthma: Taking albuterol  as needed. 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 Referral placed to PT. Patient established with orthopedic surgery. Lyrica  may help with this. Follow-up PRN.  Chronic hip pain/arthritis: Uncontrolled. PT, ortho and lyrica  per above.  Edema: Chronic and stable. Continue lasix  as needed   Elevated Cholesterol: Chronic and uncontrolled. Patient does not want to be on medication. Diet and lifestyle re-inforced - consider fish oil.  Vitamin B12 deficiency: Chronic and stable. Recheck levels today.  Vitamin D deficiency: Chronic and stable - recheck levels today  Smoking: Patient still using cigarettes. Counseled briefly, but patient did not want to quit.   Discussed colon cancer screening with the patient as patient is due for this. Patient opted to have cologuard done.  Encouraged patient to complete this    Also encouraged patient to complete CT lung screening, mammogram and DEXA scan          Return in about 3 months (around 05/02/2023) for ADHD.         An electronic signature was used to authenticate this note.    --Heide Cowing, MD

## 2023-02-01 LAB — CBC WITH AUTO DIFFERENTIAL
Basophils %: 1 % (ref 0–1)
Basophils Absolute: 0.1 10*3/uL (ref 0.0–0.1)
Eosinophils %: 1 % (ref 0–7)
Eosinophils Absolute: 0.1 10*3/uL (ref 0.0–0.4)
Hematocrit: 37.1 % (ref 35.0–47.0)
Hemoglobin: 11.3 g/dL — ABNORMAL LOW (ref 11.5–16.0)
Immature Granulocytes %: 0 % (ref 0.0–0.5)
Immature Granulocytes Absolute: 0 10*3/uL (ref 0.00–0.04)
Lymphocytes %: 26 % (ref 12–49)
Lymphocytes Absolute: 2.6 10*3/uL (ref 0.8–3.5)
MCH: 22.6 pg — ABNORMAL LOW (ref 26.0–34.0)
MCHC: 30.5 g/dL (ref 30.0–36.5)
MCV: 74.3 FL — ABNORMAL LOW (ref 80.0–99.0)
MPV: 11 FL (ref 8.9–12.9)
Monocytes %: 7 % (ref 5–13)
Monocytes Absolute: 0.7 10*3/uL (ref 0.0–1.0)
Neutrophils %: 65 % (ref 32–75)
Neutrophils Absolute: 6.5 10*3/uL (ref 1.8–8.0)
Nucleated RBCs: 0.4 /100{WBCs} — ABNORMAL HIGH
Platelets: 428 10*3/uL — ABNORMAL HIGH (ref 150–400)
RBC: 4.99 M/uL (ref 3.80–5.20)
RDW: 16.2 % — ABNORMAL HIGH (ref 11.5–14.5)
WBC: 10 10*3/uL (ref 3.6–11.0)
nRBC: 0.04 10*3/uL — ABNORMAL HIGH (ref 0.00–0.01)

## 2023-02-01 LAB — BASIC METABOLIC PANEL
Anion Gap: 9 mmol/L (ref 2–12)
BUN/Creatinine Ratio: 11 — ABNORMAL LOW (ref 12–20)
BUN: 10 mg/dL (ref 6–20)
CO2: 22 mmol/L (ref 21–32)
Calcium: 8.9 mg/dL (ref 8.5–10.1)
Chloride: 107 mmol/L (ref 97–108)
Creatinine: 0.9 mg/dL (ref 0.55–1.02)
Est, Glom Filt Rate: 71 mL/min/{1.73_m2} (ref >60)
Glucose: 86 mg/dL (ref 65–100)
Potassium: 4.9 mmol/L (ref 3.5–5.1)
Sodium: 138 mmol/L (ref 136–145)

## 2023-02-01 LAB — VITAMIN D 25 HYDROXY: Vit D, 25-Hydroxy: 36.4 ng/mL (ref 30–100)

## 2023-02-01 LAB — VITAMIN B12: Vitamin B-12: 418 pg/mL (ref 193–986)

## 2023-02-13 NOTE — Telephone Encounter (Signed)
 Left message for patient to schedule mammogram ordered by PCP  Requested patient to return call to panel manager at [phone] to schedule mammogram or notify that mammogram   has been completed at an outside facility.

## 2023-02-22 ENCOUNTER — Telehealth

## 2023-02-22 MED ORDER — TRAZODONE HCL 150 MG PO TABS
150 MG | ORAL_TABLET | Freq: Every evening | ORAL | 1 refills | Status: DC
Start: 2023-02-22 — End: 2023-07-14

## 2023-02-22 NOTE — Telephone Encounter (Signed)
[facility] is requesting a 90 days supply.     Last appointment: 01/31/2023 MD Dierdre Searles   Next appointment: Nothing scheduled   Previous refill encounter(s):   12/28/2022 Desyrel #60 with 5 refills.     For Pharmacy Admin Tracking Only    Program: Medication Refill  Intervention Detail: New Rx: 1, reason: Patient Preference  Time Spent (min): 5  Requested Prescriptions     Pending Prescriptions Disp Refills    traZODone (DESYREL) 150 MG tablet 180 tablet 0     Sig: Take 1-2 tablets by mouth nightly

## 2023-02-22 NOTE — Telephone Encounter (Signed)
Medication refilled.    Unk Lightning, MD

## 2023-03-30 ENCOUNTER — Encounter

## 2023-03-30 MED ORDER — PANTOPRAZOLE SODIUM 40 MG PO TBEC
40 | ORAL_TABLET | Freq: Every day | ORAL | 0 refills | 90.00000 days | Status: DC
Start: 2023-03-30 — End: 2024-03-27

## 2023-03-30 NOTE — Telephone Encounter (Signed)
 Last appointment: 01/31/2023 MD Dierdre Searles   Next appointment: Nothing scheduled   Previous refill encounter(s):   11/29/2022 Protonix #90     For Pharmacy Admin Tracking Only    Program: Medication Refill  Intervention Detail: New Rx: 1, reason: Patient Preferenc

## 2023-03-30 NOTE — Telephone Encounter (Signed)
 Medication refilled.    Unk Lightning, MD

## 2023-07-09 ENCOUNTER — Encounter: Primary: Student in an Organized Health Care Education/Training Program

## 2023-07-14 ENCOUNTER — Telehealth

## 2023-07-14 MED ORDER — TRAZODONE HCL 150 MG PO TABS
150 | ORAL_TABLET | Freq: Every evening | ORAL | 1 refills | 90.00000 days | Status: DC
Start: 2023-07-14 — End: 2023-11-24

## 2023-07-14 NOTE — Telephone Encounter (Signed)
 Patient is requesting a urgent refill of Trazadone 150 mg

## 2023-07-17 ENCOUNTER — Encounter

## 2023-07-17 MED ORDER — FLUTICASONE PROPIONATE 50 MCG/ACT NA SUSP
50 | Freq: Two times a day (BID) | NASAL | 5 refills | Status: DC
Start: 2023-07-17 — End: 2023-07-20

## 2023-07-17 NOTE — Telephone Encounter (Signed)
 Last appointment: 01/31/2023 MD Dierdre Searles   Next appointment: Nothing scheduled   Previous refill encounter(s):   06/23/2022 Flonase #16 grams with 11 refills.     For Pharmacy Admin Tracking Only    Program: Medication Refill  Intervention Detail: New Rx: 1, reason: Patient Preference  Time Spent (min): 5    Requested Prescriptions     Pending Prescriptions Disp Refills    fluticasone (FLONASE ALLERGY RELIEF) 50 MCG/ACT nasal spray 16 g 0     Sig: 1 spray by Nasal route in the morning and at bedtime

## 2023-07-19 ENCOUNTER — Encounter

## 2023-07-19 MED ORDER — DULOXETINE HCL 60 MG PO CPEP
60 | ORAL_CAPSULE | Freq: Every day | ORAL | 1 refills | 30.00000 days | Status: DC
Start: 2023-07-19 — End: 2023-11-24

## 2023-07-19 NOTE — Telephone Encounter (Signed)
 Medication refilled.    Unk Lightning, MD

## 2023-07-19 NOTE — Telephone Encounter (Signed)
 Last appointment: 01/31/23  MD Dierdre Searles  Next appointment: None   Previous refill encounter(s): 07/21/22 90 + 3    Requested Prescriptions     Pending Prescriptions Disp Refills    DULoxetine (CYMBALTA) 60 MG extended release capsule 90 capsule 3     Sig: Take 1 capsule by mouth daily     For Pharmacy Admin Tracking Only    Program: Medication Refill  CPA in place:    Recommendation Provided To:   Intervention Detail: New Rx: 1, reason: Patient Preference  Intervention Accepted By:   Eugenia Pancoast Closed?:    Time Spent (min): 5

## 2023-07-20 ENCOUNTER — Ambulatory Visit
Admit: 2023-07-20 | Discharge: 2023-07-20 | Payer: MEDICARE | Primary: Student in an Organized Health Care Education/Training Program

## 2023-07-20 VITALS — BP 121/75 | HR 102 | Temp 98.20000°F | Ht 64.0 in | Wt 131.4 lb

## 2023-07-20 DIAGNOSIS — B9689 Other specified bacterial agents as the cause of diseases classified elsewhere: Secondary | ICD-10-CM

## 2023-07-20 MED ORDER — FLUTICASONE PROPIONATE 50 MCG/ACT NA SUSP
50 | Freq: Two times a day (BID) | NASAL | 5 refills | 30.00000 days | Status: DC
Start: 2023-07-20 — End: 2023-11-24

## 2023-07-20 MED ORDER — AMOXICILLIN 500 MG PO CAPS
500 | ORAL_CAPSULE | Freq: Two times a day (BID) | ORAL | 0 refills | Status: AC
Start: 2023-07-20 — End: 2023-07-27

## 2023-07-20 NOTE — Patient Instructions (Signed)
 Thank you for visiting [facility] today.    Flonase (over the counter) nasal spray, once a day  Saline nasal sprays 1-3 days   Afrin nasal spray for no longer than 3-5 days  Ibuprofen (400-800 mg) every 8 hours; Tylenol 325-500 mg every 6 hours)   Zyrtec/Xyzal/Allegra/Claritin during the day or Benadryl at night may help with allergies.  You may use the decongestant version of these medications as well.  Simple foods like chicken noodle soup, smoothies, hot tea with lemon and honey may also help  Steam inhalation, humidifier, warm compresses  Increase oral fluids to maintain hydration  Vitamin D 4000 IU each day for one month  1/2 teaspoon turmeric each day for anti-inflammation  Avoid smoking and minimize contact with environmental irritants  Antibiotics with food and probiotics     Please follow up with your primary care provider if your symptoms last more than 10 days or worsen.    Please go immediately to the Emergency Department if you develop:  Fever higher than 102F (38.9C), sudden and severe pain in the face and head, trouble seeing or seeing double, trouble thinking clearly, swelling or redness around one or both eyes or a stiff neck

## 2023-07-20 NOTE — Progress Notes (Signed)
 Subjective     Chief Complaint   Patient presents with    Sinus Problem     Green suff coming out of nose, ears clogged, cough, scratchy throat, fatigue suspects sinus infection x one month         She is a 68 year old female with mild asthma, presenting with sinus issues persisting for over a month. She reports clogged ears, possibly due to wax, and associated hearing problems.     The patient experiences a dry cough, occasionally with morning phlegm. She denies a runny nose but can expel green mucus once or twice daily. The patient also complains of dry eyes, a sore throat, and an altered voice.  The patient reports chest tightness without wheezing and uses her inhaler 2-3 times a day, 2 puffs each time, with slight symptom improvement. She occasionally experiences night coughs, which she attributes to post-nasal drip.    She denies fever, chills, vomiting, or diarrhea. She has body aches, which are controlled with Aleve.    She notes a recent decrease in appetite and episodes of hot flashes and sweating during the night.  The patient is a current smoker, having reduced from one pack daily to one pack every 3 days over the past 5-6 months. She drinks 3-4 bottles of water daily        Sinus Problem        Allergies   Allergen Reactions    Latex Swelling    Hydromorphone Nausea And Vomiting    Codeine Nausea And Vomiting and Dizziness or Vertigo       Social History     Tobacco Use    Smoking status: Every Day     Current packs/day: 0.50     Average packs/day: 0.5 packs/day for 42.1 years (21.0 ttl pk-yrs)     Types: Cigarettes     Start date: 06/22/1981    Smokeless tobacco: Never   Substance Use Topics    Alcohol use: Never    Drug use: Never           Objective     Physical Exam  Vitals and nursing note reviewed.   Constitutional:       General: She is not in acute distress.     Appearance: Normal appearance. She is not ill-appearing.   HENT:      Head: Normocephalic and atraumatic.      Nose: Congestion present.       Mouth/Throat:      Mouth: Mucous membranes are dry.      Comments: No redness in throat.   Cardiovascular:      Rate and Rhythm: Regular rhythm. Tachycardia present.      Pulses: Normal pulses.   Pulmonary:      Effort: Pulmonary effort is normal.      Breath sounds: Normal breath sounds.   Neurological:      Mental Status: She is alert.         Assessment & Plan     Diagnoses and all orders for this visit:  Acute bacterial sinusitis  -     amoxicillin (AMOXIL) 500 MG capsule; Take 1 capsule by mouth 2 times daily for 7 days  Encounter for medication refill  -     fluticasone (FLONASE ALLERGY RELIEF) 50 MCG/ACT nasal spray; 1 spray by Nasal route in the morning and at bedtime    - Prescribed amoxicillin for antibiotic treatment. Antibiotics with food and probiotics   - Reordered nasal spray (previously  prescribed by Dr. Nedra Hai)  - Advised to continue using nasal spray as it may help with symptoms  - Encouraged continued efforts to reduce smoking  -Steam inhalation, humidifier, warm compresses  - drinking enough water.   -Please follow up with your primary care provider if your symptoms last more than 10 days or worsen.  - Please go immediately to the Emergency Department if you develop:  Fever higher than 102F (38.9C), sudden and severe pain in the face and head, trouble seeing or seeing double, trouble thinking clearly, swelling or redness around one or both eyes or a stiff neck    -  *ATTENTION:  This note has been created by a medical student for educational purposes only.  Please do not refer to the content of this note for clinical decision-making, billing, or other purposes.  Please see attending physician's note to obtain clinical information on this patient.*       I have discussed the results, diagnosis and treatment plan with the patient.  The patient also understands that early in the process of an illness, an urgent care workup can be falsely reassuring.  Routine discharge counseling and specific  return precautions discussed with patient and the patient understands that worsening, changing or persistent symptoms should prompt an immediate return to the urgent care or emergency department.  Patient/Guardian expressed understanding and agrees with the discharge plan.  No further questions at time of discharge.    Theodosia Quay, APRN - CNP

## 2023-11-13 NOTE — Telephone Encounter (Signed)
 PT called stating she would like to be seen by her PCP as soon as possible, pt stated she has stomach issues, ear pain, esophagus issues and pt stated there's more but did not go into more details. The provider does not have any availability this week. Pt would like to be seen this week.

## 2023-11-24 ENCOUNTER — Ambulatory Visit
Admit: 2023-11-24 | Payer: Medicare (Managed Care) | Attending: Student in an Organized Health Care Education/Training Program | Primary: Student in an Organized Health Care Education/Training Program

## 2023-11-24 VITALS — BP 118/81 | HR 100 | Temp 98.00000°F | Resp 16 | Ht 64.0 in | Wt 131.4 lb

## 2023-11-24 DIAGNOSIS — Z0000 Encounter for general adult medical examination without abnormal findings: Principal | ICD-10-CM

## 2023-11-24 MED ORDER — AMPHETAMINE-DEXTROAMPHETAMINE 5 MG PO TABS
5 | ORAL_TABLET | Freq: Two times a day (BID) | ORAL | 0 refills | 30.00000 days | Status: AC
Start: 2023-11-24 — End: 2024-01-24

## 2023-11-24 MED ORDER — DULOXETINE HCL 60 MG PO CPEP
60 | ORAL_CAPSULE | Freq: Every day | ORAL | 1 refills | 90.00000 days | Status: DC
Start: 2023-11-24 — End: 2024-03-27

## 2023-11-24 MED ORDER — TRAZODONE HCL 150 MG PO TABS
150 | ORAL_TABLET | Freq: Every evening | ORAL | 1 refills | 30.00000 days | Status: DC
Start: 2023-11-24 — End: 2024-03-27

## 2023-11-24 MED ORDER — AMPHETAMINE-DEXTROAMPHETAMINE 5 MG PO TABS
5 | ORAL_TABLET | Freq: Two times a day (BID) | ORAL | 0 refills | 30.00000 days | Status: DC
Start: 2023-11-24 — End: 2024-03-27

## 2023-11-24 MED ORDER — FLUTICASONE PROPIONATE 50 MCG/ACT NA SUSP
50 | Freq: Two times a day (BID) | NASAL | 5 refills | 60.00000 days | Status: DC
Start: 2023-11-24 — End: 2024-03-27

## 2023-11-24 NOTE — Progress Notes (Signed)
 RM:13    No chief complaint on file.      Vitals:    11/24/23 1145   BP: 118/81   BP Site: Left Upper Arm   Patient Position: Sitting   BP Cuff Size: Large Adult   Pulse: 100   Resp: 16   Temp: 98 F (36.7 C)   TempSrc: Oral   SpO2: 97%   Weight: 59.6 kg (131 lb 6.4 oz)   Height: 1.626 m (5' 4)        FASTING: Yes    Have you been to the ER, urgent care clinic since your last visit?  Hospitalized since your last visit?    NO    "Have you seen or consulted any other health care providers outside of [facility] since your last visit?"    NO    Have you had a mammogram?"   NO - pt states she doesn't want one.    No breast cancer screening on file         "Have you had a colorectal cancer screening such as a colonoscopy/FIT/Cologuard?    NO- patient wants a referral for a GI for colonoscopy    No colonoscopy on file  No cologuard on file  No FIT/FOBT on file   No flexible sigmoidoscopy on file         Click Here for Release of Records Request

## 2023-11-24 NOTE — Progress Notes (Signed)
 Linda Hull (DOB:  [birth year]) is a 68 y.o. female, Established patient, here for evaluation of the following chief complaint(s):  Follow-up and Medicare AWV        Subjective   SUBJECTIVE/OBJECTIVE:  Patient presents today for follow-up for the following:    Chronic problems:  Chronic back, hip and neck pain: Saw orthopedic surgery, but has not gone back. Has not seen PT. She does use a walker to get around. XR showed scoliosis, lordosis and degenerative disc disease.   Has had more falls recently - Has fallen 6 times, unconscious for 3  Tinnitus and dizziness - Tinnitus has worsened.     Allergies/congestion: On flonase   Mild persistent asthma - On advair  BID and has albuterol  PRN - has barely needed it so she has cut     down on Advair     ADHD: States she is on Adderall 10 mg daily - she does not take this every day. We started her on 5 mg BID. She has not had this refilled in a long time. She does well with this dose without side effects and it does help with symptoms    Bipolar II: has not seen psychiatrist in 11-12 years. States this is well controlled. I did note she is on Cymbalta  60 mg, but no medications for bipolar    GERD with history of duodenal/gastric ulcers: Patient is on protonix  40 mg daily    Leg swelling: She has lasix  that she uses as needed - 20 mg PRN - No known history of heart failure.   Has had issues with swelling since age 25.    Vitamin B12/D deficiency - not really taking supplements. Was on B12 shots in the past    Iron deficiency - would like levels rechecked      Preventative care:  Health Maintenance Due   Topic Date Due    DTaP/Tdap/Td vaccine (1 - Tdap) Never done    Pneumococcal 50+ years Vaccine (1 of 2 - PCV) Never done    Breast cancer screen  Never done    Colorectal Cancer Screen  Never done    Shingles vaccine (1 of 2) Never done    Lung Cancer Screening &/or Counseling  Never done    DEXA (modify frequency per FRAX score)  Never done    Respiratory Syncytial Virus (RSV)  Pregnant or age 82 yrs+ (1 - Risk 60-74 years 1-dose series) Never done    Depression Monitoring  11/29/2023      Pap smear: Been >5 years  Colonoscopy: 14-15 years ago. Discussed repeat - patient opted for Colonoscopy  Patient needs to schedule CT lung, Mammogram, and DEXA  Discussed vaccinations - refused    Past Medical History:   Diagnosis Date    Duodenal ulcer 07/01/2021    Gastric ulcer 07/01/2021     History reviewed. No pertinent surgical history.   Family History   Problem Relation Age of Onset    Lung Cancer Mother         non smoker    Hypertension Mother     Thalassemia Mother     High Blood Pressure Father     High Cholesterol Father     High Cholesterol Brother     Diabetes Brother     High Cholesterol Brother     Diabetes Brother      Social History     Socioeconomic History    Marital status: Single     Spouse name: Not on file  Number of children: Not on file    Years of education: Not on file    Highest education level: Not on file   Occupational History    Not on file   Tobacco Use    Smoking status: Every Day     Current packs/day: 0.50     Average packs/day: 0.5 packs/day for 42.4 years (21.2 ttl pk-yrs)     Types: Cigarettes     Start date: 06/22/1981    Smokeless tobacco: Never   Substance and Sexual Activity    Alcohol use: Yes     Alcohol/week: 2.0 standard drinks of alcohol     Types: 2 Glasses of wine per week     Comment: daily    Drug use: Never    Sexual activity: Not Currently   Other Topics Concern    Not on file   Social History Narrative    Not on file     Social Drivers of Health     Financial Resource Strain: Low Risk  (07/21/2022)    Overall Financial Resource Strain (CARDIA)     Difficulty of Paying Living Expenses: Not hard at all   Recent Concern: Financial Resource Strain - High Risk (06/22/2022)    Overall Financial Resource Strain (CARDIA)     Difficulty of Paying Living Expenses: Very hard   Food Insecurity: No Food Insecurity (11/24/2023)    Hunger Vital Sign     Worried  About Running Out of Food in the Last Year: Never true     Ran Out of Food in the Last Year: Never true   Transportation Needs: No Transportation Needs (11/24/2023)    PRAPARE - Therapist, art (Medical): No     Lack of Transportation (Non-Medical): No   Physical Activity: Inactive (11/24/2023)    Exercise Vital Sign     Days of Exercise per Week: 0 days     Minutes of Exercise per Session: 0 min   Stress: Not on file   Social Connections: Not on file   Intimate Partner Violence: Not on file   Housing Stability: Low Risk  (11/24/2023)    Housing Stability Vital Sign     Unable to Pay for Housing in the Last Year: No     Number of Times Moved in the Last Year: 0     Homeless in the Last Year: No       Current Outpatient Medications:     fluticasone  (FLONASE  ALLERGY RELIEF) 50 MCG/ACT nasal spray, 1 spray by Nasal route in the morning and at bedtime, Disp: 16 g, Rfl: 5    [START ON 12/25/2023] amphetamine -dextroamphetamine  (ADDERALL, 5MG ,) 5 MG tablet, Take 1 tablet by mouth 2 times daily for 30 days. Max Daily Amount: 10 mg, Disp: 60 tablet, Rfl: 0    amphetamine -dextroamphetamine  (ADDERALL, 5MG ,) 5 MG tablet, Take 1 tablet by mouth 2 times daily for 30 days. Max Daily Amount: 10 mg, Disp: 60 tablet, Rfl: 0    traZODone  (DESYREL ) 150 MG tablet, Take 1-2 tablets by mouth nightly, Disp: 180 tablet, Rfl: 1    DULoxetine  (CYMBALTA ) 60 MG extended release capsule, Take 1 capsule by mouth daily, Disp: 90 capsule, Rfl: 1    pantoprazole  (PROTONIX ) 40 MG tablet, Take 1 tablet by mouth daily, Disp: 90 tablet, Rfl: 0    albuterol  sulfate HFA (PROVENTIL ;VENTOLIN ;PROAIR ) 108 (90 Base) MCG/ACT inhaler, Inhale 2 puffs into the lungs 4 times daily, Disp: 18 g, Rfl: 11  furosemide  (LASIX ) 20 MG tablet, Take 1 tablet by mouth 2 times daily as needed (leg swelling), Disp: 60 tablet, Rfl: 5    magnesium chloride (MAG64) 64 MG TBEC extended release tablet, Take 1 tablet by mouth daily (with breakfast), Disp: ,  Rfl:   Allergies   Allergen Reactions    Latex Swelling    Hydromorphone Nausea And Vomiting    Codeine Nausea And Vomiting and Dizziness or Vertigo       Review of Systems   Constitutional:  Negative for chills and fever.   HENT:  Negative for congestion, ear pain and postnasal drip.    Respiratory:  Negative for cough and shortness of breath.    Cardiovascular:  Negative for chest pain and palpitations.   Gastrointestinal:  Negative for abdominal pain, blood in stool, constipation, diarrhea, nausea and vomiting.   Musculoskeletal:  Positive for arthralgias, back pain, gait problem, neck pain and neck stiffness. Negative for myalgias.   Neurological:  Negative for dizziness, numbness and headaches.   Psychiatric/Behavioral:  Positive for decreased concentration and sleep disturbance. Negative for dysphoric mood. The patient is not nervous/anxious.           Objective   Vitals:    11/24/23 1145   BP: 118/81   BP Site: Left Upper Arm   Patient Position: Sitting   BP Cuff Size: Large Adult   Pulse: 100   Resp: 16   Temp: 98 F (36.7 C)   TempSrc: Oral   SpO2: 97%   Weight: 59.6 kg (131 lb 6.4 oz)   Height: 1.626 m (5' 4)      Physical Exam  Vitals and nursing note reviewed.   Constitutional:       General: She is not in acute distress.     Appearance: Normal appearance. She is not ill-appearing.   HENT:      Head:      Comments: Non healing ulcer in base of R ear     Mouth/Throat:      Mouth: Mucous membranes are moist.      Pharynx: Oropharynx is clear.   Cardiovascular:      Rate and Rhythm: Regular rhythm.      Heart sounds: No murmur heard.  Pulmonary:      Effort: Pulmonary effort is normal.      Breath sounds: Normal breath sounds. No wheezing.   Abdominal:      General: Bowel sounds are normal. There is no distension.      Palpations: Abdomen is soft.      Tenderness: There is no abdominal tenderness.   Musculoskeletal:      Right lower leg: No edema.      Left lower leg: No edema.   Neurological:      Mental  Status: She is alert.   Psychiatric:         Mood and Affect: Mood normal.         Behavior: Behavior normal.                Assessment & Plan   ASSESSMENT/PLAN:  1. Medicare annual wellness visit, subsequent  -     CBC with Auto Differential; Future  -     Comprehensive Metabolic Panel; Future  2. Attention deficit hyperactivity disorder (ADHD), predominantly inattentive type  -     amphetamine -dextroamphetamine  (ADDERALL, 5MG ,) 5 MG tablet; Take 1 tablet by mouth 2 times daily for 30 days. Max Daily Amount: 10 mg, Disp-60 tablet, R-0Print  -  amphetamine -dextroamphetamine  (ADDERALL, 5MG ,) 5 MG tablet; Take 1 tablet by mouth 2 times daily for 30 days. Max Daily Amount: 10 mg, Disp-60 tablet, R-0Normal  -     Urine Drug Screen; Future  3. Chronic fatigue  -     Vitamin D 25 Hydroxy; Future  -     Vitamin B12; Future  -     TSH; Future  4. Bipolar affective disorder, remission status unspecified (HCC)  -     Comprehensive Metabolic Panel; Future  -     TSH; Future  -     DULoxetine  (CYMBALTA ) 60 MG extended release capsule; Take 1 capsule by mouth daily, Disp-90 capsule, R-1Normal  5. Primary insomnia  -     traZODone  (DESYREL ) 150 MG tablet; Take 1-2 tablets by mouth nightly, Disp-180 tablet, R-1Normal  6. Gastroesophageal reflux disease without esophagitis  -     AFL - Abou-Assi, Souheil, MD, Gastroenterology, [place] ([address])  7. History of gastric ulcer  8. 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 Mild persistent asthma without complication  -     fluticasone  (FLONASE  ALLERGY RELIEF) 50 MCG/ACT nasal spray; 1 spray by Nasal route in the morning and at bedtime, Disp-16 g, R-5Normal  10. 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)  11. Chronic bilateral low back pain without sciatica  -     [facility] - [facility] ([facility] [place]  -     External Referral To Pain Clinic  12. Bilateral  hip joint arthritis  -     [facility] - [facility] ([facility] [place]  -     External Referral To Pain Clinic  13. Edema of both lower extremities  -     Comprehensive Metabolic Panel; Future  14. Poor dentition  15. Tinnitus of both ears  -     AFL - Oralia Rogue, MD, Otolaryngology, [place] ([address])  16. Ear drainage right  -     AFL - Oralia Rogue, MD, Otolaryngology,  ([address])  17. Elevated cholesterol  -     Lipid Panel; Future  18. Impaired fasting glucose  -     Hemoglobin A1C; Future  19. Iron deficiency anemia, unspecified iron deficiency anemia type  -     Iron and TIBC; Future  -     Ferritin; Future  20. Vitamin B12 deficiency  -     CBC with Auto Differential; Future  -     Vitamin B12; Future  21. Vitamin D deficiency  -     Vitamin D 25 Hydroxy; Future  -     Comprehensive Metabolic Panel; Future  22. At high risk for falls  -     [facility] - [facility] ([facility] [place]  23. Personal history of tobacco use  -     PR VISIT TO DISCUSS LUNG CA SCREEN W LDCT  -     CT Lung Screen (Initial/Annual/Baseline); Future  24. Asymptomatic menopause  -     DEXA BONE DENSITY AXIAL SKELETON; Future  25. Screening for osteoporosis  -     DEXA BONE DENSITY AXIAL SKELETON; Future  26. Encounter for screening mammogram for breast cancer  -     MAM DIGITAL SCREEN W OR WO CAD BILATERAL; Future  27. Screening  for colorectal cancer  -     AFL - Abou-Assi, Souheil, MD, Gastroenterology, [place] ([address])  28. Medication monitoring encounter  -     Urine Drug Screen; Future           Patient presents today for follow-up for the above issues. See plan below:  Patient completed all required medicare wellness visit forms and documentation today. A personalized prevention plan was made, reviewed and provided to the patient today. See additional document forms and below for full details. Patient is in good health and no deficits were  identified aside from above issues which are chronic and stable with exception of any acute problems or changes made above. All necessary medications were refilled. All other concerns were addressed.    Diagnoses #1, 25-27 for Medicare Wellness/Preventive visit.   Due to significant separate problems unrelated to [facility] Wellness Visit, also addressed following diagnoses/problems at visit as below (diagnoses #2-24, 28 above).      ADHD: Chronic and stable. Patient is on Adderall 5 mg BID. PDMP reviewed and consistent. UDS has been good. Continue current RX. Repeat UDS. Follow-up in 3 months  Chronic fatigue: Ongoing - check vitamin levels and thyroid function. Hold on lyrica  due to fall risk.  Bipolar disorder: Chronic and stable per patient. Referral placed to neuropsychology previously - she has not scheduled. Follow-up PRN.  Insomnia: Patient is on trazodone  150-300 mg nightly. Risks addressed with patient today - she is aware, refill provided.  GERD with history of ulcers: Chronic and stable. Continue protonix  for 40 mg daily. Discussed trying to switch to EOD dosing given risk for depression and decreased bone density with prolonged PPI use - patient states she still has significant symptoms - referral to GI placed  See #7 above'  Mild persistent asthma: Taking albuterol  as needed. 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 Referral placed to PT. Patient established with orthopedic surgery. Continue duloxetine . Follow-up PRN.  Chronic hip pain/arthritis: Uncontrolled. PT, ortho and duloxetine  per above.  Edema: Chronic and stable. Continue lasix  as needed   Poor dentition: Chronic. Recommended dentistry - information for [facility] dentistry provided.  Tinnitus: Chronic and worsening. With balance and falls, referral placed to ENT  Increased ear drainage -  referral to ENT  Elevated Cholesterol: Chronic and uncontrolled. Patient does not want to be on medication. Diet and lifestyle re-inforced - consider fish oil.  Impaired glucose tolerance: Chronic and stable. Will check for diabetes today with A1c test.  Iron deficiency anemia: noted in the past. Chronic and stable. Repeat iron levels.  Vitamin B12 deficiency: Chronic and stable. Recheck levels today.  Vitamin D deficiency: Chronic and stable - recheck levels today   On the basis of positive falls risk screening, assessment and plan is as follows: home safety tips provided, referral to physical therapy provided for strength and balance training.  Smoking: Patient still using cigarettes. Counseled briefly, but patient did not want to quit.   Asymptomatic menopause: Noted. DEXA scan reordered  Patient is due for bone density scan. This was discussed. Patient is agreeable to obtaining this. DEXA scan was ordered.  Breast cancer screening was discussed today as patient is due for this. Patient was agreeable to obtaining a mammogram, which was ordered today. Patient encouraged to call to schedule this.  Discussed colon cancer screening  with the patient as patient is due for this. Patient opted to have cologuard done.  Encouraged patient to complete this  UDS per above    Also encouraged patient to complete CT lung screening, mammogram and DEXA scan          Return in about 3 months (around 02/26/2024) for ADHD.         An electronic signature was used to authenticate this note.    --Heide Cowing, MD      Medicare Annual Wellness Visit    Linda Hull is here for Follow-up and Medicare AWV    Assessment & Plan   Medicare annual wellness visit, subsequent  -     CBC with Auto Differential; Future  -     Comprehensive Metabolic Panel; Future  Attention deficit hyperactivity disorder (ADHD), predominantly inattentive type  -     amphetamine -dextroamphetamine  (ADDERALL, 5MG ,) 5 MG tablet; Take 1 tablet by mouth 2 times daily  for 30 days. Max Daily Amount: 10 mg, Disp-60 tablet, R-0Print  -     amphetamine -dextroamphetamine  (ADDERALL, 5MG ,) 5 MG tablet; Take 1 tablet by mouth 2 times daily for 30 days. Max Daily Amount: 10 mg, Disp-60 tablet, R-0Normal  -     Urine Drug Screen; Future  Chronic fatigue  -     Vitamin D 25 Hydroxy; Future  -     Vitamin B12; Future  -     TSH; Future  Bipolar affective disorder, remission status unspecified (HCC)  -     Comprehensive Metabolic Panel; Future  -     TSH; Future  -     DULoxetine  (CYMBALTA ) 60 MG extended release capsule; Take 1 capsule by mouth daily, Disp-90 capsule, R-1Normal  Primary insomnia  -     traZODone  (DESYREL ) 150 MG tablet; Take 1-2 tablets by mouth nightly, Disp-180 tablet, R-1Normal  Gastroesophageal reflux disease without esophagitis  -     AFL - Abou-Assi, Souheil, MD, Gastroenterology, [place] ([address])  History of gastric ulcer  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  -     CBC with Auto Differential; Future  Mild persistent asthma without complication  -     fluticasone  (FLONASE  ALLERGY RELIEF) 50 MCG/ACT nasal spray; 1 spray by Nasal route in the morning and at bedtime, Disp-16 g, R-5Normal  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)  -     AFL - Jama Millman, MD, Dermatology, [facility])  Chronic bilateral low back pain without sciatica  -     [facility] - [facility] ([facility]), [place]  -     External Referral To Pain Clinic  Bilateral hip joint arthritis  -     [facility] - [facility] ([facility]), [place]  -     External Referral To Pain Clinic  Edema of both lower extremities  -     Comprehensive Metabolic Panel; Future  Poor dentition  Tinnitus of both ears  -     AFL - Oralia Rogue, MD, Otolaryngology, [place] ([address])  Ear drainage right  -     AFL - Oralia Rogue, MD, Otolaryngology, [place] ([address])  Elevated cholesterol  -     Lipid Panel; Future  Impaired fasting glucose  -     Hemoglobin  A1C; Future  Iron deficiency anemia, unspecified iron deficiency anemia type  -     Iron and TIBC; Future  -     Ferritin; Future  Vitamin  B12 deficiency  -     CBC with Auto Differential; Future  -     Vitamin B12; Future  Vitamin D deficiency  -     Vitamin D 25 Hydroxy; Future  -     Comprehensive Metabolic Panel; Future  At high risk for falls  -     [facility] - [facility] ([facility] [place]  Personal history of tobacco use  -     PR VISIT TO DISCUSS LUNG CA SCREEN W LDCT  -     CT Lung Screen (Initial/Annual/Baseline); Future  Asymptomatic menopause  -     DEXA BONE DENSITY AXIAL SKELETON; Future  Screening for osteoporosis  -     DEXA BONE DENSITY AXIAL SKELETON; Future  Encounter for screening mammogram for breast cancer  -     MAM DIGITAL SCREEN W OR WO CAD BILATERAL; Future  Screening for colorectal cancer  -     AFL - Abou-Assi, Souheil, MD, Gastroenterology, [place] ([address])  Medication monitoring encounter  -     Urine Drug Screen; Future       Return in about 3 months (around 02/26/2024) for ADHD.     Subjective       Patient's complete Health Risk Assessment and screening values have been reviewed and are found in Flowsheets. The following problems were reviewed today and where indicated follow up appointments were made and/or referrals ordered.    Positive Risk Factor Screenings with Interventions:    Fall Risk:  Do you feel unsteady or are you worried about falling? : (!) yes  2 or more falls in past year?: (!) yes  Fall with injury in past year?: (!) yes  Interventions:    Reviewed medications, home hazards, visual acuity, and co-morbidities that can increase risk for falls  See AVS for additional education material             Inactivity:  On average, how many days per week do you engage in moderate to strenuous exercise (like a brisk walk)?: 0 days (!) Abnormal  On average, how many minutes do you engage in exercise at this level?: 0  min  Interventions:  See AVS for additional education material      Dentist Screen:  Have you seen the dentist within the past year?: (!) No    Intervention:  Advised to schedule with their dentist  Information to [facility] provided    Hearing Screen:  Do you or your family notice any trouble with your hearing that hasn't been managed with hearing aids?: (!) Yes (constant tinnitus)    Interventions:  Referred to ENT    Vision Screen:  Do you have difficulty driving, watching TV, or doing any of your daily activities because of your eyesight?: (!) Yes (just reading up close)  Have you had an eye exam within the past year?: (!) No  Interventions:   Patient encouraged to make appointment with their eye specialist    Safety:  Do you have non-slip mats or non-slip surfaces or shower bars or grab bars in your shower or bathtub?: (!) No (patient states she puts towels on the floor)  Interventions:  See AVS for additional education material  See A/P for plan and any pertinent orders     ADL's:   Patient reports needing help with:  Select all that apply: (!) Walking/Balance  Select all that apply: [employer], Food Preparation  Interventions:  Patient declined any further  interventions or treatment    Advanced Directives:  Do you have a Living Will?: (!) No    Intervention:  has NO advanced directive - information provided        Tobacco Use:    Tobacco Use      Smoking status: Every Day        Packs/day: 0.50        Years: 0.5 packs/day for 42.4 years (21.2 ttl pk-yrs)        Types: Cigarettes        Start date: 06/22/1981      Smokeless tobacco: Never     Interventions:  See AVS for additional education material                      Objective   Vitals:    11/24/23 1145   BP: 118/81   BP Site: Left Upper Arm   Patient Position: Sitting   BP Cuff Size: Large Adult   Pulse: 100   Resp: 16   Temp: 98 F (36.7 C)   TempSrc: Oral   SpO2: 97%   Weight: 59.6 kg (131 lb 6.4 oz)   Height: 1.626 m (5' 4)      Body mass index is 22.55  kg/m.                  Allergies   Allergen Reactions    Latex Swelling    Hydromorphone Nausea And Vomiting    Codeine Nausea And Vomiting and Dizziness or Vertigo     Prior to Visit Medications    Medication Sig Taking? Authorizing Provider   fluticasone  (FLONASE  ALLERGY RELIEF) 50 MCG/ACT nasal spray 1 spray by Nasal route in the morning and at bedtime Yes Lucillie Pickerel, MD   amphetamine -dextroamphetamine  (ADDERALL, 5MG ,) 5 MG tablet Take 1 tablet by mouth 2 times daily for 30 days. Max Daily Amount: 10 mg Yes Lucillie Pickerel, MD   amphetamine -dextroamphetamine  (ADDERALL, 5MG ,) 5 MG tablet Take 1 tablet by mouth 2 times daily for 30 days. Max Daily Amount: 10 mg Yes Lucillie Pickerel, MD   traZODone  (DESYREL ) 150 MG tablet Take 1-2 tablets by mouth nightly Yes Lucillie Pickerel, MD   DULoxetine  (CYMBALTA ) 60 MG extended release capsule Take 1 capsule by mouth daily Yes Lucillie Pickerel, MD   pantoprazole  (PROTONIX ) 40 MG tablet Take 1 tablet by mouth daily Yes Lucillie Pickerel, MD   albuterol  sulfate HFA (PROVENTIL ;VENTOLIN ;PROAIR ) 108 (90 Base) MCG/ACT inhaler Inhale 2 puffs into the lungs 4 times daily Yes Lucillie Pickerel, MD   furosemide  (LASIX ) 20 MG tablet Take 1 tablet by mouth 2 times daily as needed (leg swelling) Yes Lucillie Pickerel, MD   magnesium chloride (MAG64) 64 MG TBEC extended release tablet Take 1 tablet by mouth daily (with breakfast) Yes [provider]       CareTeam (Including outside providers/suppliers regularly involved in providing care):   Patient Care Team:  Lucillie Pickerel, MD as PCP - General (Family Medicine)  Lucillie Pickerel, MD as PCP - Empaneled Provider     Recommendations for Preventive Services Due: see orders and patient instructions/AVS.  Recommended screening schedule for the next 5-10 years is provided to the patient in written form: see Patient Instructions/AVS.     Reviewed and updated this visit:  Tobacco  Allergies  Meds  Problems  Med Hx  Surg Hx  Fam Hx  Sexual   Hx

## 2023-11-24 NOTE — Patient Instructions (Signed)
 Preventing Falls: Care Instructions  Injuries and health problems such as trouble walking or poor eyesight can increase your risk of falling. So can some medicines. But there are things you can do to help prevent falls. You can exercise to get stronger. You can also arrange your home to make it safer.    Talk to your doctor about the medicines you take. Ask if any of them increase the risk of falls and whether they can be changed or stopped.   Try to exercise regularly. It can help improve your strength and balance. This can help lower your risk of falling.         Practice fall safety and prevention.   Wear low-heeled shoes that fit well and give your feet good support. Talk to your doctor if you have foot problems that make this hard.  Carry a cellphone or wear a medical alert device that you can use to call for help.  Use stepladders instead of chairs to reach high objects. Don't climb if you're at risk for falls. Ask for help, if needed.  Wear the correct eyeglasses, if you need them.        Make your home safer.   Remove rugs, cords, clutter, and furniture from walkways.  Keep your house well lit. Use night-lights in hallways and bathrooms.  Install and use sturdy handrails on stairways.  Wear nonskid footwear, even inside. Don't walk barefoot or in socks without shoes.        Be safe outside.   Use handrails, curb cuts, and ramps whenever possible.  Keep your hands free by using a shoulder bag or backpack.  Try to walk in well-lit areas. Watch out for uneven ground, changes in pavement, and debris.  Be careful in the winter. Walk on the grass or gravel when sidewalks are slippery. Use de-icer on steps and walkways. Add non-slip devices to shoes.    Put grab bars and nonskid mats in your shower or tub and near the toilet. Try to use a shower chair or bath bench when bathing.   Get into a tub or shower by putting in your weaker leg first. Get out with your strong side first. Have a phone or medical alert  device in the bathroom with you.   Where can you learn more?  Go to [employer] and enter G117 to learn more about Preventing Falls: Care Instructions.  Current as of: December 14, 2022  Content Version: 14.5   [address], [place].   Care instructions adapted under license by [facility]. If you have questions about a medical condition or this instruction, always ask your healthcare professional. Romayne Alderman, [facility], disclaims any warranty or liability for your use of this information.         Learning About Being Active as an Older Adult  Why is being active important as you get older?     Being active is one of the best things you can do for your health. And it's never too late to start. Being active--or getting active, if you aren't already--has definite benefits. It can:  Give you more energy,  Keep your mind sharp.  Improve balance to reduce your risk of falls.  Help you manage chronic illness with fewer medicines.  No matter how old you are, how fit you are, or what health problems you have, there is a form of activity that will work for you. And the more physical activity you can do, the better your overall  health will be.  What kinds of activity can help you stay healthy?  Being more active will make your daily activities easier. Physical activity includes planned exercise and things you do in daily life. There are four types of activity:  Aerobic.  Doing aerobic activity makes your heart and lungs strong.  Includes walking, dancing, and gardening.  Aim for at least 2 hours spread throughout the week.  It improves your energy and can help you sleep better.  Muscle-strengthening.  This type of activity can help maintain muscle and strengthen bones.  Includes climbing stairs, using resistance bands, and lifting or carrying heavy loads.  Aim for at least twice a week.  It can help protect the knees and other joints.  Stretching.  Stretching gives you better range of  motion in joints and muscles.  Includes upper arm stretches, calf stretches, and gentle yoga.  Aim for at least twice a week, preferably after your muscles are warmed up from other activities.  It can help you function better in daily life.  Balancing.  This helps you stay coordinated and have good posture.  Includes heel-to-toe walking, tai chi, and certain types of yoga.  Aim for at least 3 days a week.  It can reduce your risk of falling.  Even if you have a hard time meeting the recommendations, it's better to be more active than less active. All activity done in each category counts toward your weekly total. You'd be surprised how daily things like carrying groceries, keeping up with grandchildren, and taking the stairs can add up.  What keeps you from being active?  If you've had a hard time being more active, you're not alone. Maybe you remember being able to do more. Or maybe you've never thought of yourself as being active. It's frustrating when you can't do the things you want. Being more active can help. What's holding you back?  Getting started.  Have a goal, but break it into easy tasks. Small steps build into big accomplishments.  Staying motivated.  If you feel like skipping your activity, remember your goal. Maybe you want to move better and stay independent. Every activity gets you one step closer.  Not feeling your best.  Start with 5 minutes of an activity you enjoy. Prove to yourself you can do it. As you get comfortable, increase your time.  You may not be where you want to be. But you're in the process of getting there. Everyone starts somewhere.  How can you find safe ways to stay active?  Talk with your doctor about any physical challenges you're facing. Make a plan with your doctor if you have a health problem or aren't sure how to get started with activity.  If you're already active, ask your doctor if there is anything you should change to stay safe as your body and health change.  If you  tend to feel dizzy after you take medicine, avoid activity at that time. Try being active before you take your medicine. This will reduce your risk of falls.  If you plan to be active at home, make sure to clear your space before you get started. Remove things like TV cords, coffee tables, and throw rugs. It's safest to have plenty of space to move freely.  The key to getting more active is to take it slow and steady. Try to improve only a little bit at a time. Pick just one area to improve on at first. And if an activity  hurts, stop and talk to your doctor.  Where can you learn more?  Go to [employer] and enter P600 to learn more about Learning About Being Active as an Older Adult.  Current as of: December 14, 2022  Content Version: 14.5   [address] [place].   Care instructions adapted under license by [facility]. If you have questions about a medical condition or this instruction, always ask your healthcare professional. Romayne Alderman, [facility], disclaims any warranty or liability for your use of this information.         Learning About Dental Care for Older Adults  Dental care for older adults: Overview  Dental care for older people is much the same as for younger adults. But older adults do have concerns that younger adults do not. Older adults may have problems with gum disease and decay on the roots of their teeth. They may need missing teeth replaced or broken fillings fixed. Or they may have dentures that need to be cared for. Some older adults may have trouble holding a toothbrush.  You can help remind the person you are caring for to brush and floss their teeth or to clean their dentures. In some cases, you may need to do the brushing and other dental care tasks. People who have trouble using their hands or who have dementia may need this extra help.  How can you help with dental care?  Normal dental care  To keep the teeth and gums healthy:  Brush the teeth with  fluoride toothpaste twice a day--in the morning and at night--and floss at least once a day. Plaque can quickly build up on the teeth of older adults.  Watch for the signs of gum disease. These signs include gums that bleed after brushing or after eating hard foods, such as apples.  See a dentist regularly. Many experts recommend checkups every 6 months.  Keep the dentist up to date on any new medications the person is taking.  Encourage a balanced diet that includes whole grains, vegetables, and fruits, and that is low in saturated fat and sodium.  Encourage the person you're caring for not to use tobacco products. They can affect dental and general health.  Many older adults have a fixed income and feel that they can't afford dental care. But most towns and cities have programs in which dentists help older adults by lowering fees. Contact your area's public health offices or social services for information about dental care in your area.  Using a toothbrush  Older adults with arthritis sometimes have trouble brushing their teeth because they can't easily hold the toothbrush. Their hands and fingers may be stiff, painful, or weak. If this is the case, you can:  Offer an Mining engineer toothbrush.  Enlarge the handle of a non-electric toothbrush by wrapping a sponge, an elastic bandage, or adhesive tape around it.  Push the toothbrush handle through a ball made of rubber or soft foam.  Make the handle longer and thicker by taping Popsicle sticks or tongue depressors to it.  You may also be able to buy special toothbrushes, toothpaste dispensers, and floss holders.  Your doctor may recommend a soft-bristle toothbrush if the person you care for bleeds easily. Bleeding can happen because of a health problem or from certain medicines.  A toothpaste for sensitive teeth may help if the person you care for has sensitive teeth.  How do you brush and floss someone's teeth?  If the person you are caring for has  a hard time cleaning  their teeth on their own, you may need to brush and floss their teeth for them. It may be easiest to have the person sit and face away from you, and to sit or stand behind them. That way you can steady their head against your arm as you reach around to floss and brush their teeth. Choose a place that has good lighting and is comfortable for both of you.  Before you begin, gather your supplies. You will need gloves, floss, a toothbrush, and a container to hold water if you are not near a sink. Wash and dry your hands well and put on gloves. Start by flossing:  Gently work a piece of floss between each of the teeth toward the gums. A plastic flossing tool may make this easier, and they are available at most drugstores.  Curve the floss around each tooth into a U-shape and gently slide it under the gum line.  Move the floss firmly up and down several times to scrape off the plaque.  After you've finished flossing, throw away the used floss and begin brushing:  Wet the brush and apply toothpaste.  Place the brush at a 45-degree angle where the teeth meet the gums. Press firmly, and move the brush in small circles over the surface of the teeth.  Be careful not to brush too hard. Vigorous brushing can make the gums pull away from the teeth and can scratch the tooth enamel.  Brush all surfaces of the teeth, on the tongue side and on the cheek side. Pay special attention to the front teeth and all surfaces of the back teeth.  Brush chewing surfaces with short back-and-forth strokes.  After you've finished, help the person rinse the remaining toothpaste from their mouth.  Where can you learn more?  Go to [employer] and enter F944 to learn more about Learning About Dental Care for Older Adults.  Current as of: December 14, 2022  Content Version: 14.5   [address] [place].   Care instructions adapted under license by [facility]. If you have questions about a medical condition or this  instruction, always ask your healthcare professional. Romayne Alderman, [facility], disclaims any warranty or liability for your use of this information.         Hearing Loss: Care Instructions  Overview     Hearing loss is a sudden or slow decrease in how well you hear. It can range from slight to profound. Permanent hearing loss can occur with aging. It also can happen when you are exposed long-term to loud noise. Examples include listening to loud music, riding motorcycles, or being around other loud machines.  Hearing loss can affect your work and home life. It can make you feel lonely or depressed. You may feel that you have lost your independence. But hearing aids and other devices can help you hear better and feel connected to others.  Follow-up care is a key part of your treatment and safety. Be sure to make and go to all appointments, and call your doctor if you are having problems. It's also a good idea to know your test results and keep a list of the medicines you take.  How can you care for yourself at home?  Avoid loud noises whenever possible. This helps keep your hearing from getting worse.  Always wear hearing protection around loud noises.  Wear a hearing aid as directed.  A professional can help you pick a hearing aid that will  work best for you.  You can also get hearing aids over the counter for mild to moderate hearing loss.  Have hearing tests as your doctor suggests. They can show whether your hearing has changed. Your hearing aid may need to be adjusted.  Use other devices as needed. These may include:  Telephone amplifiers and hearing aids that can connect to a television, stereo, radio, or microphone.  Devices that use lights or vibrations. These alert you to the doorbell, a ringing telephone, or a baby monitor.  Television closed-captioning. This shows the words at the bottom of the screen. Most new TVs can do this.  TTY (text telephone). This lets you type messages back and forth on the  telephone instead of talking or listening. These devices are also called TDD. When messages are typed on the keyboard, they are sent over the phone line to a receiving TTY. The message is shown on a monitor.  Use text messaging, social media, and email if it is hard for you to communicate by telephone.  Try to learn a listening technique called speechreading. It is not lipreading. You pay attention to people's gestures, expressions, posture, and tone of voice. These clues can help you understand what a person is saying. Face the person you are talking to, and have them face you. Make sure the lighting is good. You need to see the other person's face clearly.  Think about counseling if you need help to adjust to your hearing loss.  When should you call for help?  Watch closely for changes in your health, and be sure to contact your doctor if:    You think your hearing is getting worse.     You have new symptoms, such as dizziness or nausea.   Where can you learn more?  Go to [employer] and enter R798 to learn more about Hearing Loss: Care Instructions.  Current as of: March 12, 2023  Content Version: 14.5   [address], [place].   Care instructions adapted under license by [facility]. If you have questions about a medical condition or this instruction, always ask your healthcare professional. Romayne Alderman, [facility], disclaims any warranty or liability for your use of this information.         Learning About Vision Tests  What are vision tests?     The four most common vision tests are visual acuity tests, refraction, visual field tests, and color vision tests.  Visual acuity (sharpness) tests  These tests are used:  To see if you need glasses or contact lenses.  To monitor an eye problem.  To check an eye injury.  Visual acuity tests are done as part of routine exams. You may also have this test when you get your driver's license or apply for some types of jobs.  Visual  field tests  These tests are used:  To check for vision loss in any area of your range of vision.  To screen for certain eye diseases.  To look for nerve damage after a stroke, head injury, or other problem that could reduce blood flow to the brain.  Refraction and color tests  A refraction test is done to find the right prescription for glasses and contact lenses.  A color vision test is done to check for color blindness.  Color vision is often tested as part of a routine exam. You may also have this test when you apply for a job where recognizing different colors is important, such as truck  driving, electronics, or the [employer].  How are vision tests done?  Visual acuity test   You cover one eye at a time.  You read aloud from a wall chart across the room.  You read aloud from a small card that you hold in your hand.  Refraction   You look into a special device.  The device puts lenses of different strengths in front of each eye to see how strong your glasses or contact lenses need to be.  Visual field tests   Your doctor may have you look through special machines.  Or your doctor may simply have you stare straight ahead while they move a finger into and out of your field of vision.  Color vision test   You look at pieces of printed test patterns in various colors. You say what number or symbol you see.  Your doctor may have you trace the number or symbol using a pointer.  How do these tests feel?  There is very little chance of having a problem from this test. If dilating drops are used for a vision test, they may make the eyes sting and cause a medicine taste in the mouth.  Follow-up care is a key part of your treatment and safety. Be sure to make and go to all appointments, and call your doctor if you are having problems. It's also a good idea to know your test results and keep a list of the medicines you take.  Where can you learn more?  Go to [employer] and enter G551 to learn more  about Learning About Vision Tests.  Current as of: December 14, 2022  Content Version: 14.5   [address] [place].   Care instructions adapted under license by [facility]. If you have questions about a medical condition or this instruction, always ask your healthcare professional. Romayne Alderman, [facility], disclaims any warranty or liability for your use of this information.         Learning About Activities of Daily Living  What are activities of daily living?     Activities of daily living (ADLs) are the basic self-care tasks you do every day. These include eating, bathing, dressing, and moving around.  As you age, and if you have health problems, you may find that it's harder to do some of these tasks. If so, your doctor can suggest ideas that may help.  To measure what kind of help you may need, your doctor will ask how well you are able to do ADLs. Let your doctor know if there are any tasks that you are having trouble doing. This is an important first step to getting help. And when you have the help you need, you can stay as independent as possible.  How will a doctor assess your ADLs?  Asking about ADLs is part of a routine health checkup your doctor will likely do as you age. Your health check might be done in a doctor's office, in your home, or at a hospital. The goal is to find out if you are having any problems that could make it hard to care for yourself or that make it unsafe for you to be on your own.  To measure your ADLs, your doctor will ask how hard it is for you to do routine tasks. Your doctor may also want to know if you have changed the way you do a task because of a health problem. Your doctor may watch how you:  Walk back and  forth.  Keep your balance while you stand or walk.  Move from sitting to standing or from a bed to a chair.  Button or unbutton a [employer] fast streamer.  Remove and put on your shoes.  It's common to feel a little worried or anxious if you find you can't do all  the things you used to be able to do. Talking with your doctor about ADLs is a way to make sure you're as safe as possible and able to care for yourself as well as you can. You may want to bring a caregiver, friend, or family member to your checkup. They can help you talk to your doctor.  Follow-up care is a key part of your treatment and safety. Be sure to make and go to all appointments, and call your doctor if you are having problems. It's also a good idea to know your test results and keep a list of the medicines you take.  Current as of: March 09, 2023  Content Version: 14.5   [address] [place].   Care instructions adapted under license by [facility]. If you have questions about a medical condition or this instruction, always ask your healthcare professional. Romayne Alderman, [facility], disclaims any warranty or liability for your use of this information.         Advance Directives: Care Instructions  Overview  An advance directive is a legal way to state your wishes at the end of your life. It tells your loved ones and doctor what to do if you can't say what you want.  There are two main types of advance directives. You can change them any time your wishes change.  Living will. This form tells your loved ones and doctor your wishes about life support and other treatment. The form is also called a declaration.  Medical power of attorney. This form lets you name a person to make treatment decisions for you when you can't speak for yourself. This person is called a health care agent (health care proxy, health care surrogate). The form is also called a durable power of attorney for health care.  If you do not have an advance directive, decisions about your medical care may be made by a family member or doctor who doesn't know you or by a judge.  It may help to think of an advance directive as a gift to the people who care for you. If you have one, they won't have to make tough decisions by  themselves.  For more information, including forms for your state, see the [employer] website ([employer]cy).  Follow-up care is a key part of your treatment and safety. Be sure to make and go to all appointments, and call your doctor if you are having problems. It's also a good idea to know your test results and keep a list of the medicines you take.  What should you include in an advance directive?  Many states have a unique advance directive form. (It may ask you to address specific issues.) Or you might use a universal form that's approved by many states.  If your form doesn't tell you what to address, it may be hard to know what to include in your advance directive. Use the questions below to help you get started.  Who do you want to make decisions about your medical care if you are not able to?  What life-support measures do you want if you have a serious illness that gets worse over time or  can't be cured?  What are you most afraid of that might happen? (Maybe you're afraid of having pain, losing your independence, or being kept alive by machines.)  Where would you prefer to die? (Your home? A hospital? A nursing home?)  Do you want to donate your organs when you die?  Do you want certain religious practices performed before you die?  When should you call for help?  Be sure to contact your doctor if you have any questions.  Where can you learn more?  Go to [employer] and enter R264 to learn more about Advance Directives: Care Instructions.  Current as of: September 13, 2022  Content Version: 14.5   [address] [place].   Care instructions adapted under license by [facility]. If you have questions about a medical condition or this instruction, always ask your healthcare professional. Romayne Alderman, [facility], disclaims any warranty or liability for your use of this information.         Learning About Lung Cancer Screening  What is  screening for lung cancer?     Lung cancer screening is a way to find some lung cancers early, before a person has any symptoms of the cancer.  Lung cancer screening may help those who have the highest risk for lung cancer--people age 50 and older who are or were heavy smokers. For most people, who aren't at increased risk, screening for lung cancer probably isn't helpful.  Screening won't prevent cancer. And it may not find all lung cancers. Lung cancer screening may lower the risk of dying from lung cancer in a small number of people.  How is it done?  Lung cancer screening is done with a low-dose CT (computed tomography) scan. A CT scan uses X-rays, or radiation, to make detailed pictures of your body. Experts recommend that screening be done in medical centers that focus on finding and treating lung cancer.  Who is screening recommended for?  Lung cancer screening is recommended for people age 47 and older who are or were heavy smokers. That means people with a smoking history of at least 20 pack years. A pack year is a way to measure how heavy a smoker you are or were.  To figure out your pack years, multiply how many packs a day on average (assuming 20 cigarettes per pack) you have smoked by how many years you have smoked. For example:  If you smoked 1 pack a day for 20 years, that's 1 times 20. So you have a smoking history of 20 pack years.  If you smoked 2 packs a day for 10 years, that's 2 times 10. So you have a smoking history of 20 pack years.  Experts agree that screening is for people who have a high risk of lung cancer. But experts don't agree on what high risk means. Some say people age 27 or older with at least a 20-pack-year smoking history are high risk. Others say it's people age 43 or older with a 30-pack-year history.  To see if you could benefit from screening, first find out if you are at high risk for lung cancer. Your doctor can help you decide your lung cancer risk.  What are the risks of  screening?  CT screening for lung cancer isn't perfect. It can show an abnormal result when it turns out there wasn't any cancer. This is called a false-positive result. This means you may need more tests to make sure you don't have cancer. These tests can  be harmful and cause a lot of worry.  These tests may include more CT scans and invasive testing like a lung biopsy. In a biopsy, the doctor takes a sample of tissue from inside your lung so it can be looked at under a microscope. A biopsy is the only way to tell if you have lung cancer. If the biopsy finds cancer, you and your doctor will have to decide how or whether to treat it.  Some lung cancers found on CT scans are harmless and would not have caused a problem if they had not been found through screening. But because doctors can't tell which ones will turn out to be harmless, most will be treated. This means that you may get treatment--including surgery, radiation, or chemotherapy--that you don't need.  There is a risk of damage to cells or tissue from being exposed to radiation, including the small amounts used in CTs, X-rays, and other medical tests. Over time, exposure to radiation may cause cancer and other health problems. But in most cases, the risk of getting cancer from being exposed to small amounts of radiation is low. It's not a reason to avoid these tests for most people.  What are the benefits of screening?  Your scan may be normal (negative).  For some people who are at higher risk, screening lowers the chance of dying of lung cancer. How much and how long you smoked helps to determine your risk level. Screening can find some cancers early, when treatment may be more likely to work.  What happens after screening?  The results of your CT scan will be sent to your doctor. Someone from your care team will explain the results of your scan and answer any questions you may have. If you need any follow-up, he or she will help you understand what to do  next.  After a lung cancer screening, you can go back to your usual activities right away.  A lung cancer screening test can't tell if you have lung cancer. If your results are positive, your doctor can't tell whether an abnormal finding is a harmless nodule, cancer, or something else without doing more tests.  What can you do to help prevent lung cancer?  Some lung cancers can't be prevented. But if you smoke, quitting smoking is the best step you can take to prevent lung cancer. If you want to quit, your doctor can recommend medicines or other ways to help.  Follow-up care is a key part of your treatment and safety. Be sure to make and go to all appointments, and call your doctor if you are having problems. It's also a good idea to know your test results and keep a list of the medicines you take.  Where can you learn more?  Go to [employer] and enter Q940 to learn more about Learning About Lung Cancer Screening.  Current as of: March 10, 2023  Content Version: 14.5   [address] [place].   Care instructions adapted under license by [facility]. If you have questions about a medical condition or this instruction, always ask your healthcare professional. Romayne Alderman, [facility], disclaims any warranty or liability for your use of this information.         A Healthy Heart: Care Instructions  Overview     Coronary artery disease, also called heart disease, occurs when a substance called plaque builds up in the vessels that supply oxygen-rich blood to your heart muscle. This can narrow the blood vessels and  reduce blood flow. A heart attack happens when blood flow is completely blocked. A high-fat diet, smoking, and other factors increase the risk of heart disease.  Your doctor has found that you have a chance of having heart disease. A heart-healthy lifestyle can help keep your heart healthy and prevent heart disease. This lifestyle includes eating healthy, being active,  staying at a weight that's healthy for you, and not smoking or using tobacco. It also includes taking medicines as directed, managing other health conditions, and trying to get a healthy amount of sleep.  Follow-up care is a key part of your treatment and safety. Be sure to make and go to all appointments, and call your doctor if you are having problems. It's also a good idea to know your test results and keep a list of the medicines you take.  How can you care for yourself at home?  Diet    Use less salt when you cook and eat. This helps lower your blood pressure. Taste food before salting. Add only a little salt when you think you need it. With time, your taste buds will adjust to less salt.     Eat fewer snack items, fast foods, canned soups, and other high-salt, high-fat, processed foods.     Read food labels and try to avoid saturated and trans fats. They increase your risk of heart disease by raising cholesterol levels.     Limit the amount of solid fat--butter, margarine, and shortening--you eat. Use olive, peanut, or canola oil when you cook. Bake, broil, and steam foods instead of frying them.     Eat a variety of fruit and vegetables every day. Dark green, deep orange, red, or yellow fruits and vegetables are especially good for you. Examples include spinach, carrots, peaches, and berries.     Foods high in fiber can reduce your cholesterol and provide important vitamins and minerals. High-fiber foods include whole-grain cereals and breads, oatmeal, beans, brown rice, citrus fruits, and apples.     Eat lean proteins. Heart-healthy proteins include seafood, lean meats and poultry, eggs, beans, peas, nuts, seeds, and soy products.     Limit drinks and foods with added sugar. These include candy, desserts, and soda pop.   Heart-healthy lifestyle    If your doctor recommends it, get more exercise. For many people, walking is a good choice. Or you may want to swim, bike, or do other activities. Bit by bit,  increase the time you're active every day. Try for at least 30 minutes on most days of the week.     Try to quit or cut back on using tobacco and other nicotine products. This includes smoking and vaping. If you need help quitting, talk to your doctor about stop-smoking programs and medicines. These can increase your chances of quitting for good. Quitting is one of the most important things you can do to protect your heart. It is never too late to quit. Try to avoid secondhand smoke too.     Stay at a weight that's healthy for you. Talk to your doctor if you need help losing weight.     Try to get 7 to 9 hours of sleep each night.     Limit alcohol to 2 drinks a day for men and 1 drink a day for women. Too much alcohol can cause health problems.     Manage other health problems such as diabetes, high blood pressure, and high cholesterol. If you think you may have a  problem with alcohol or drug use, talk to your doctor.   Medicines    Take your medicines exactly as prescribed. Call your doctor if you think you are having a problem with your medicine.     If your doctor recommends aspirin, take the amount directed each day. Make sure you take aspirin and not another kind of pain reliever, such as acetaminophen (Tylenol).   When should you call for help?   Call 911 if you have symptoms of a heart attack. These may include:    Chest pain or pressure, or a strange feeling in the chest.     Sweating.     Shortness of breath.     Pain, pressure, or a strange feeling in the back, neck, jaw, or upper belly or in one or both shoulders or arms.     Lightheadedness or sudden weakness.     A fast or irregular heartbeat.   After you call 911, the operator may tell you to chew 1 adult-strength or 2 to 4 low-dose aspirin. Wait for an ambulance. Do not try to drive yourself.  Watch closely for changes in your health, and be sure to contact your doctor if you have any problems.  Where can you learn more?  Go to  [employer] and enter F075 to learn more about A Healthy Heart: Care Instructions.  Current as of: December 14, 2022  Content Version: 14.5   [address] [place].   Care instructions adapted under license by [facility]. If you have questions about a medical condition or this instruction, always ask your healthcare professional. Romayne Alderman, [facility], disclaims any warranty or liability for your use of this information.    Personalized Preventive Plan for Diego Lessley - 11/24/2023  Medicare offers a range of preventive health benefits. Some of the tests and screenings are paid in full while other may be subject to a deductible, co-insurance, and/or copay.  Some of these benefits include a comprehensive review of your medical history including lifestyle, illnesses that may run in your family, and various assessments and screenings as appropriate.  After reviewing your medical record and screening and assessments performed today your provider may have ordered immunizations, labs, imaging, and/or referrals for you.  A list of these orders (if applicable) as well as your Preventive Care list are included within your After Visit Summary for your review.

## 2023-11-25 LAB — URINE DRUG SCREEN
Amphetamine, Urine: NEGATIVE
Barbiturates, Urine: NEGATIVE
Benzodiazepines, Urine: NEGATIVE
Cocaine, Urine: NEGATIVE
Methadone, Urine: NEGATIVE
Opiates, Urine: NEGATIVE
Phencyclidine, Urine: NEGATIVE
THC, TH-Cannabinol, Urine: NEGATIVE

## 2023-11-25 LAB — IRON AND TIBC
Iron % Saturation: 30 % (ref 20–50)
Iron: 98 ug/dL (ref 35–150)
TIBC: 331 ug/dL (ref 250–450)

## 2023-11-25 LAB — CBC WITH AUTO DIFFERENTIAL
Basophils %: 0.8 % (ref 0.0–1.0)
Basophils Absolute: 0.06 K/UL (ref 0.00–0.10)
Eosinophils %: 1.2 % (ref 0.0–7.0)
Eosinophils Absolute: 0.09 K/UL (ref 0.00–0.40)
Hematocrit: 42.1 % (ref 35.0–47.0)
Hemoglobin: 12.2 g/dL (ref 11.5–16.0)
Immature Granulocytes %: 0.1 % (ref 0.0–0.5)
Immature Granulocytes Absolute: 0.01 K/UL (ref 0.00–0.04)
Lymphocytes %: 22 % (ref 12.0–49.0)
Lymphocytes Absolute: 1.59 K/UL (ref 0.80–3.50)
MCH: 21.5 pg — ABNORMAL LOW (ref 26.0–34.0)
MCHC: 29 g/dL — ABNORMAL LOW (ref 30.0–36.5)
MCV: 74.1 FL — ABNORMAL LOW (ref 80.0–99.0)
MPV: 10.6 FL (ref 8.9–12.9)
Monocytes %: 7.2 % (ref 5.0–13.0)
Monocytes Absolute: 0.52 K/UL (ref 0.00–1.00)
Neutrophils %: 68.7 % (ref 32.0–75.0)
Neutrophils Absolute: 4.96 K/UL (ref 1.80–8.00)
Nucleated RBCs: 0 /100{WBCs}
Platelets: 540 K/uL — ABNORMAL HIGH (ref 150–400)
RBC: 5.68 M/uL — ABNORMAL HIGH (ref 3.80–5.20)
RDW: 17.3 % — ABNORMAL HIGH (ref 11.5–14.5)
WBC: 7.2 K/uL (ref 3.6–11.0)
nRBC: 0 K/uL (ref 0.00–0.01)

## 2023-11-25 LAB — COMPREHENSIVE METABOLIC PANEL
ALT: 18 U/L (ref 12–78)
AST: 21 U/L (ref 15–37)
Albumin/Globulin Ratio: 1.2 (ref 1.1–2.2)
Albumin: 4 g/dL (ref 3.5–5.0)
Alk Phosphatase: 87 U/L (ref 45–117)
Anion Gap: 8 mmol/L (ref 2–12)
BUN/Creatinine Ratio: 13 (ref 12–20)
BUN: 13 mg/dL (ref 6–20)
CO2: 25 mmol/L (ref 21–32)
Calcium: 9.5 mg/dL (ref 8.5–10.1)
Chloride: 106 mmol/L (ref 97–108)
Creatinine: 1.04 mg/dL — ABNORMAL HIGH (ref 0.55–1.02)
Est, Glom Filt Rate: 59 ml/min/1.73m2 — ABNORMAL LOW (ref >60)
Globulin: 3.3 g/dL (ref 2.0–4.0)
Glucose: 114 mg/dL — ABNORMAL HIGH (ref 65–100)
Potassium: 4.4 mmol/L (ref 3.5–5.1)
Sodium: 139 mmol/L (ref 136–145)
Total Bilirubin: 0.5 mg/dL (ref 0.2–1.0)
Total Protein: 7.3 g/dL (ref 6.4–8.2)

## 2023-11-25 LAB — TSH: TSH, 3rd Generation: 0.88 u[IU]/mL (ref 0.36–3.74)

## 2023-11-25 LAB — LIPID PANEL
Chol/HDL Ratio: 4.3 (ref 0.0–5.0)
Cholesterol, Total: 260 mg/dL — ABNORMAL HIGH (ref <200)
HDL: 60 mg/dL
LDL Cholesterol: 158.2 mg/dL — ABNORMAL HIGH (ref 0–100)
Triglycerides: 209 mg/dL — ABNORMAL HIGH (ref <150)
VLDL Cholesterol Calculated: 41.8 mg/dL

## 2023-11-25 LAB — VITAMIN D 25 HYDROXY: Vit D, 25-Hydroxy: 69.7 ng/mL (ref 30–100)

## 2023-11-25 LAB — FERRITIN: Ferritin: 28 ng/mL (ref 8–252)

## 2023-11-25 LAB — HEMOGLOBIN A1C
Estimated Avg Glucose: 97 mg/dL
Hemoglobin A1C: 5 % (ref 4.0–5.6)

## 2023-11-25 LAB — VITAMIN B12: Vitamin B-12: 327 pg/mL (ref 193–986)

## 2023-12-03 ENCOUNTER — Emergency Department
Admit: 2023-12-04 | Payer: Medicare (Managed Care) | Primary: Student in an Organized Health Care Education/Training Program

## 2023-12-03 ENCOUNTER — Inpatient Hospital Stay
Admit: 2023-12-03 | Discharge: 2023-12-04 | Disposition: A | Discharge status: Home / Self Care | Payer: Medicare (Managed Care) | Attending: Student in an Organized Health Care Education/Training Program

## 2023-12-03 DIAGNOSIS — J988 Other specified respiratory disorders: Principal | ICD-10-CM

## 2023-12-03 DIAGNOSIS — R0981 Nasal congestion: Principal | ICD-10-CM

## 2023-12-03 NOTE — ED Provider Notes (Signed)
[facility]  EMERGENCY DEPARTMENT ENCOUNTER      Pt Name: Linda Hull  MRN: [id number]  Birthdate 11/08/1955  Date of evaluation: 12/03/2023  Provider: Rexene FORBES Ernst, PA-C    CHIEF COMPLAINT       Chief Complaint   Patient presents with    Shortness of Breath         HISTORY OF PRESENT ILLNESS   (Location/Symptom, Timing/Onset, Context/Setting, Quality, Duration, Modifying Factors, Severity)  Note limiting factors.   Linda Hull is a 68 y.o. female with history of  has a past medical history of Duodenal ulcer (07/01/2021) and Gastric ulcer (07/01/2021). who presents from home to [facility] ED with cc of nasal congestion and shortness of breath beginning today.  Describes the shortness of breath as difficulty breathing through her nose.  Denies chest pain.  No fevers.  Endorses cough.  Denies leg swelling or pain.  Smokes half a pack of cigarettes daily.  Endorses history of asthma.  She has run out of her home inhaler.            PCP: Lucillie Pickerel, MD    There are no other complaints, changes or physical findings at this time.                Review of External Medical Records:     Nursing Notes were reviewed.    REVIEW OF SYSTEMS    (2-9 systems for level 4, 10 or more for level 5)     Review of Systems   HENT:  Positive for congestion.        Except as noted above the remainder of the review of systems was reviewed and negative.       PAST MEDICAL HISTORY     Past Medical History:   Diagnosis Date    Duodenal ulcer 07/01/2021    Gastric ulcer 07/01/2021         SURGICAL HISTORY     No past surgical history on file.      CURRENT MEDICATIONS       Previous Medications    AMPHETAMINE -DEXTROAMPHETAMINE  (ADDERALL, 5MG ,) 5 MG TABLET    Take 1 tablet by mouth 2 times daily for 30 days. Max Daily Amount: 10 mg    AMPHETAMINE -DEXTROAMPHETAMINE  (ADDERALL, 5MG ,) 5 MG TABLET    Take 1 tablet by mouth 2 times daily for 30 days. Max Daily Amount: 10 mg    DULOXETINE  (CYMBALTA ) 60 MG EXTENDED  RELEASE CAPSULE    Take 1 capsule by mouth daily    FLUTICASONE  (FLONASE  ALLERGY RELIEF) 50 MCG/ACT NASAL SPRAY    1 spray by Nasal route in the morning and at bedtime    FUROSEMIDE  (LASIX ) 20 MG TABLET    Take 1 tablet by mouth 2 times daily as needed (leg swelling)    MAGNESIUM CHLORIDE (MAG64) 64 MG TBEC EXTENDED RELEASE TABLET    Take 1 tablet by mouth daily (with breakfast)    PANTOPRAZOLE  (PROTONIX ) 40 MG TABLET    Take 1 tablet by mouth daily    TRAZODONE  (DESYREL ) 150 MG TABLET    Take 1-2 tablets by mouth nightly       ALLERGIES     Latex, Hydromorphone, and Codeine    FAMILY HISTORY       Family History   Problem Relation Age of Onset    Lung Cancer Mother         non smoker    Hypertension Mother  Thalassemia Mother     High Blood Pressure Father     High Cholesterol Father     High Cholesterol Brother     Diabetes Brother     High Cholesterol Brother     Diabetes Brother           SOCIAL HISTORY       Social History     Socioeconomic History    Marital status: Single   Tobacco Use    Smoking status: Every Day     Current packs/day: 0.50     Average packs/day: 0.5 packs/day for 42.4 years (21.2 ttl pk-yrs)     Types: Cigarettes     Start date: 06/22/1981    Smokeless tobacco: Never   Substance and Sexual Activity    Alcohol use: Yes     Alcohol/week: 2.0 standard drinks of alcohol     Types: 2 Glasses of wine per week     Comment: daily    Drug use: Never    Sexual activity: Not Currently     Social Drivers of Health     Financial Resource Strain: Low Risk  (07/21/2022)    Overall Financial Resource Strain (CARDIA)     Difficulty of Paying Living Expenses: Not hard at all   Recent Concern: Financial Resource Strain - High Risk (06/22/2022)    Overall Financial Resource Strain (CARDIA)     Difficulty of Paying Living Expenses: Very hard   Food Insecurity: No Food Insecurity (11/24/2023)    Hunger Vital Sign     Worried About Running Out of Food in the Last Year: Never true     Ran Out of Food in the Last Year:  Never true   Transportation Needs: No Transportation Needs (11/24/2023)    PRAPARE - Therapist, art (Medical): No     Lack of Transportation (Non-Medical): No   Physical Activity: Inactive (11/24/2023)    Exercise Vital Sign     Days of Exercise per Week: 0 days     Minutes of Exercise per Session: 0 min   Housing Stability: Low Risk  (11/24/2023)    Housing Stability Vital Sign     Unable to Pay for Housing in the Last Year: No     Number of Times Moved in the Last Year: 0     Homeless in the Last Year: No           PHYSICAL EXAM    (up to 7 for level 4, 8 or more for level 5)     ED Triage Vitals   BP Systolic BP Percentile Diastolic BP Percentile Temp Temp Source Pulse Respirations SpO2   12/03/23 2004 -- -- 12/03/23 2004 12/03/23 2004 12/03/23 2004 12/03/23 2004 12/03/23 2004   121/87   98.2 F (36.8 C) Oral (!) 112 16 98 %      Height Weight - Scale         12/03/23 2001 12/03/23 2001         1.626 m (5' 4) 60.5 kg (133 lb 6.1 oz)             Body mass index is 22.89 kg/m.    Physical Exam  Constitutional:       General: She is not in acute distress.     Appearance: Normal appearance. She is not ill-appearing.   HENT:      Head: Normocephalic.      Right Ear: Tympanic membrane normal.  Left Ear: Tympanic membrane normal.      Nose: Congestion present.      Mouth/Throat:      Mouth: Mucous membranes are moist.      Pharynx: No oropharyngeal exudate or posterior oropharyngeal erythema.   Eyes:      Conjunctiva/sclera: Conjunctivae normal.   Cardiovascular:      Rate and Rhythm: Tachycardia present.      Heart sounds: Normal heart sounds.   Pulmonary:      Effort: Pulmonary effort is normal. No tachypnea, accessory muscle usage or respiratory distress.      Breath sounds: Examination of the right-lower field reveals wheezing. Examination of the left-lower field reveals wheezing. Wheezing (Mild end expiratory wheeze over lower lung fields) present.   Abdominal:      General: There is  no distension.   Musculoskeletal:         General: Normal range of motion.   Skin:     General: Skin is dry.   Neurological:      Mental Status: She is alert and oriented to person, place, and time.         DIAGNOSTIC RESULTS     EKG: All EKG's are interpreted by the Emergency Department Physician who either signs or Co-signs this chart in the absence of a cardiologist.        RADIOLOGY:   Non-plain film images such as CT, Ultrasound and MRI are read by the radiologist. Plain radiographic images are visualized and preliminarily interpreted by the emergency physician with the below findings:        Interpretation per the Radiologist below, if available at the time of this note:    CTA CHEST W WO CONTRAST PE Eval   Final Result   No acute pulmonary embolus.         Electronically signed by Mark Vaughn      XR CHEST (2 VW)   Final Result      No acute process.         Electronically signed by Oneil Collier           LABS:  Labs Reviewed   CBC WITH AUTO DIFFERENTIAL - Abnormal; Notable for the following components:       Result Value    RBC 5.32 (*)     MCV 71.1 (*)     MCH 21.8 (*)     RDW 15.8 (*)     Platelets 440 (*)     Monocytes Absolute 1.03 (*)     All other components within normal limits   COMPREHENSIVE METABOLIC PANEL - Abnormal; Notable for the following components:    Potassium 3.1 (*)     Chloride 109 (*)     Glucose 60 (*)     Creatinine 1.07 (*)     BUN/Creatinine Ratio 11 (*)     Est, Glom Filt Rate 57 (*)     All other components within normal limits   D-DIMER, QUANTITATIVE - Abnormal; Notable for the following components:    D-Dimer, Quant 0.92 (*)     All other components within normal limits   EXTRA TUBES HOLD   TROPONIN   BRAIN NATRIURETIC PEPTIDE   MAGNESIUM       All other labs were within normal range or not returned as of this dictation.    EMERGENCY DEPARTMENT COURSE and DIFFERENTIAL DIAGNOSIS/MDM:   Vitals:    Vitals:    12/03/23 2218 12/03/23 2222 12/03/23 2315 12/04/23 0111  BP:    (!)  105/55   Pulse: (!) 124 (!) 106 92 92   Resp:    14   Temp:    98.4 F (36.9 C)   TempSrc:    Oral   SpO2:   97% 98%   Weight:       Height:               Medical Decision Making  This patient presents with symptoms suspicious for likely viral upper respiratory infection. They are non-toxic appearing.  Respirations are even unlabored.  She has mild end expiratory wheezing over the lower lung fields.  No respiratory distress.  She has good oxygen saturation on room air.  Based on history and physical doubt sinusitis.  Declined COVID and flu testing. Do not suspect underlying cardiopulmonary process. I considered, but think unlikely, dangerous causes of this patient's symptoms to include ACS, CHF exacerbations, pneumonia (clear chest x-ray), pneumothorax.  Considered PE-D-dimer was elevated so proceeded with a CTA which showed no PE.  Patient with mild asthma exacerbation.  She reports feeling better after DuoNeb.  Patient tolerating PO intake well.  Potassium repleted with p.o. potassium.  Discussed supportive care measures for viral illnesses such as rest, hydration and over the counter cold and flu medications for symptomatic management.  Instructed on close follow-up with PCP.  Strict return precautions were given.    Discussed my clinical impression(s), any labs and/or radiology results with the patient. I answered any questions and addressed any concerns. Discussed the importance of following up with their primary care physician and/or specialist(s). Discussed signs or symptoms that would warrant return back to the ER for further evaluation. The patient is agreeable with discharge.        Amount and/or Complexity of Data Reviewed  Labs: ordered.  Radiology: ordered.  ECG/medicine tests: ordered.    Risk  OTC drugs.  Prescription drug management.            REASSESSMENT            CONSULTS:  None    PROCEDURES:  Unless otherwise noted below, none     Procedures      FINAL IMPRESSION      1. Nasal congestion     2. Wheezing-associated respiratory infection    3. Mild persistent asthma without complication          DISPOSITION/PLAN   DISPOSITION Decision To Discharge 12/04/2023 01:26:53 AM      PATIENT REFERRED TO:  [place]  [address]  [place]  [place] [address]  [phone]    Go in 3 days      Your Primary Care Provider    Go in 4 days        DISCHARGE MEDICATIONS:  New Prescriptions    BENZONATATE  (TESSALON ) 100 MG CAPSULE    Take 1 capsule by mouth 3 times daily as needed for Cough    PSEUDOEPHEDRINE  (DECONGESTANT) 30 MG TABLET    Take 1 tablet by mouth every 6 hours as needed for Congestion         (Please note that portions of this note were completed with a voice recognition program.  Efforts were made to edit the dictations but occasionally words are mis-transcribed.)    Brennan Litzinger E Taevion Sikora, PA-C (electronically signed)  Emergency Attending Physician / Physician Assistant / Nurse Practitioner             Carlo Rexene BRAVO, PA-C  12/04/23  0130

## 2023-12-03 NOTE — ED Triage Notes (Signed)
 Pt arrives ambulatory from home w/ cc of SOB, nasal congestion, and wheezing that started tonight.       Denies new leg swelling, and CP

## 2023-12-04 ENCOUNTER — Emergency Department
Admit: 2023-12-04 | Payer: Medicare (Managed Care) | Primary: Student in an Organized Health Care Education/Training Program

## 2023-12-04 LAB — EKG 12-LEAD
Atrial Rate: 103 {beats}/min
P Axis: 68 degrees
P-R Interval: 118 ms
Q-T Interval: 332 ms
QRS Duration: 80 ms
QTc Calculation (Bazett): 434 ms
R Axis: 60 degrees
T Axis: 70 degrees
Ventricular Rate: 103 {beats}/min

## 2023-12-04 LAB — COMPREHENSIVE METABOLIC PANEL
ALT: 16 U/L (ref 12–78)
AST: 18 U/L (ref 15–37)
Albumin/Globulin Ratio: 1.2 (ref 1.1–2.2)
Albumin: 4 g/dL (ref 3.5–5.0)
Alk Phosphatase: 97 U/L (ref 45–117)
Anion Gap: 7 mmol/L (ref 2–12)
BUN/Creatinine Ratio: 11 — ABNORMAL LOW (ref 12–20)
BUN: 12 mg/dL (ref 6–20)
CO2: 23 mmol/L (ref 21–32)
Calcium: 9.3 mg/dL (ref 8.5–10.1)
Chloride: 109 mmol/L — ABNORMAL HIGH (ref 97–108)
Creatinine: 1.07 mg/dL — ABNORMAL HIGH (ref 0.55–1.02)
Est, Glom Filt Rate: 57 ml/min/1.73m2 — ABNORMAL LOW (ref >60)
Globulin: 3.3 g/dL (ref 2.0–4.0)
Glucose: 60 mg/dL — ABNORMAL LOW (ref 65–100)
Potassium: 3.1 mmol/L — ABNORMAL LOW (ref 3.5–5.1)
Sodium: 139 mmol/L (ref 136–145)
Total Bilirubin: 0.3 mg/dL (ref 0.2–1.0)
Total Protein: 7.3 g/dL (ref 6.4–8.2)

## 2023-12-04 LAB — CBC WITH AUTO DIFFERENTIAL
Basophils %: 0.7 % (ref 0.0–1.0)
Basophils Absolute: 0.08 K/UL (ref 0.00–0.10)
Eosinophils %: 1.8 % (ref 0.0–7.0)
Eosinophils Absolute: 0.2 K/UL (ref 0.00–0.40)
Hematocrit: 37.8 % (ref 35.0–47.0)
Hemoglobin: 11.6 g/dL (ref 11.5–16.0)
Immature Granulocytes %: 0.2 % (ref 0.0–0.5)
Immature Granulocytes Absolute: 0.02 K/UL (ref 0.00–0.04)
Lymphocytes %: 21.6 % (ref 12.0–49.0)
Lymphocytes Absolute: 2.37 K/UL (ref 0.80–3.50)
MCH: 21.8 pg — ABNORMAL LOW (ref 26.0–34.0)
MCHC: 30.7 g/dL (ref 30.0–36.5)
MCV: 71.1 FL — ABNORMAL LOW (ref 80.0–99.0)
MPV: 10.2 FL (ref 8.9–12.9)
Monocytes %: 9.4 % (ref 5.0–13.0)
Monocytes Absolute: 1.03 K/UL — ABNORMAL HIGH (ref 0.00–1.00)
Neutrophils %: 66.3 % (ref 32.0–75.0)
Neutrophils Absolute: 7.26 K/UL (ref 1.80–8.00)
Nucleated RBCs: 0 /100{WBCs}
Platelets: 440 K/uL — ABNORMAL HIGH (ref 150–400)
RBC: 5.32 M/uL — ABNORMAL HIGH (ref 3.80–5.20)
RDW: 15.8 % — ABNORMAL HIGH (ref 11.5–14.5)
WBC: 11 K/uL (ref 3.6–11.0)
nRBC: 0 K/uL (ref 0.00–0.01)

## 2023-12-04 LAB — MAGNESIUM: Magnesium: 2.3 mg/dL (ref 1.6–2.4)

## 2023-12-04 LAB — EXTRA TUBES HOLD

## 2023-12-04 LAB — BRAIN NATRIURETIC PEPTIDE: NT Pro-BNP: 123 pg/mL (ref <125)

## 2023-12-04 LAB — D-DIMER, QUANTITATIVE: D-Dimer, Quant: 0.92 mg{FEU}/L — ABNORMAL HIGH (ref 0.00–0.65)

## 2023-12-04 LAB — TROPONIN: Troponin, High Sensitivity: 5 ng/L (ref 0–51)

## 2023-12-04 MED ORDER — SODIUM CHLORIDE 0.9 % IV BOLUS
0.9 | Freq: Once | INTRAVENOUS | Status: AC
Start: 2023-12-04 — End: 2023-12-03
  Administered 2023-12-04: 03:00:00 500 mL via INTRAVENOUS

## 2023-12-04 MED ORDER — ALBUTEROL SULFATE HFA 108 (90 BASE) MCG/ACT IN AERS
108 | Freq: Four times a day (QID) | RESPIRATORY_TRACT | 0 refills | 25.00000 days | Status: DC
Start: 2023-12-04 — End: 2024-02-29

## 2023-12-04 MED ORDER — IOPAMIDOL 76 % IV SOLN
76 | Freq: Once | INTRAVENOUS | Status: AC | PRN
Start: 2023-12-04 — End: 2023-12-04
  Administered 2023-12-04: 04:00:00 80 mL via INTRAVENOUS

## 2023-12-04 MED ORDER — PSEUDOEPHEDRINE HCL 30 MG PO TABS
30 | ORAL_TABLET | Freq: Four times a day (QID) | ORAL | 0 refills | Status: AC | PRN
Start: 2023-12-04 — End: 2023-12-11

## 2023-12-04 MED ORDER — POTASSIUM CHLORIDE ER 10 MEQ PO TBCR
10 | Freq: Once | ORAL | Status: AC
Start: 2023-12-04 — End: 2023-12-04
  Administered 2023-12-04: 05:00:00 40 meq via ORAL

## 2023-12-04 MED ORDER — BENZONATATE 100 MG PO CAPS
100 | ORAL_CAPSULE | Freq: Three times a day (TID) | ORAL | 0 refills | Status: AC | PRN
Start: 2023-12-04 — End: 2023-12-11

## 2023-12-04 MED ORDER — IPRATROPIUM-ALBUTEROL 0.5-2.5 (3) MG/3ML IN SOLN
0.5-2.5 | Freq: Once | RESPIRATORY_TRACT | Status: AC
Start: 2023-12-04 — End: 2023-12-03
  Administered 2023-12-04: 03:00:00 1 via RESPIRATORY_TRACT

## 2023-12-04 MED FILL — ISOVUE-370 76 % IV SOLN: 76 % | INTRAVENOUS | Qty: 100 | Fill #0

## 2023-12-04 MED FILL — POTASSIUM CHLORIDE ER 10 MEQ PO TBCR: 10 meq | ORAL | Qty: 4 | Fill #0

## 2023-12-04 MED FILL — SODIUM CHLORIDE 0.9 % IV SOLN: 0.9 % | INTRAVENOUS | Qty: 500 | Fill #0

## 2023-12-04 MED FILL — ALBUTEROL SULFATE (2.5 MG/3ML) 0.083% IN NEBU: RESPIRATORY_TRACT | Qty: 3 | Fill #0

## 2023-12-04 NOTE — Discharge Instructions (Signed)
 You were seen in the emergency department for nasal congestion and wheezing. Your symptoms appear to be due to a viral illness. Viral illnesses are treated with supportive care, including your fluid intake, over the counter fever and pain reducers such as ibuprofen or tylenol, and rest. Take all other medications as prescribed and directed.     Your condition should improve over the next 5 days with the care discussed. You should return to the ER- if you experience increased fevers that do not improve with use of Tylenol and Motrin (as directed),  Inability to tolerate oral intake, increased shortness of breath, neck stiffness, confusion, or other worsening or worrisome concerns. You should follow up with your primary care provider this week for further evaluation.

## 2023-12-06 NOTE — Telephone Encounter (Signed)
 Pt called stating she needs a ED follow up appt, provider does not have any availability. Pt would like to be squeezed in ASAP.

## 2023-12-07 NOTE — Telephone Encounter (Signed)
 I spoke to the pt ,made ED follow up ,on 12/20/2023 at 3 pm.

## 2023-12-20 ENCOUNTER — Ambulatory Visit
Payer: Medicare (Managed Care) | Attending: Student in an Organized Health Care Education/Training Program | Primary: Student in an Organized Health Care Education/Training Program

## 2024-02-21 ENCOUNTER — Ambulatory Visit
Payer: Medicare (Managed Care) | Attending: Student in an Organized Health Care Education/Training Program | Primary: Student in an Organized Health Care Education/Training Program

## 2024-02-29 ENCOUNTER — Encounter

## 2024-02-29 MED ORDER — ALBUTEROL SULFATE HFA 108 (90 BASE) MCG/ACT IN AERS
108 | Freq: Four times a day (QID) | RESPIRATORY_TRACT | 5 refills | Status: AC
Start: 2024-02-29 — End: ?

## 2024-02-29 NOTE — Telephone Encounter (Signed)
"  Medication refilled.    Heide Cowing, MD     "

## 2024-02-29 NOTE — Telephone Encounter (Signed)
"  Last appointment: 11/24/2023 MD Lucillie   Next appointment: 03/27/2024 MD Lucillie   Previous refill encounter(s):   12/04/2023 Albuterol  HFA INH #18 grams. Last prescribed by PA T. Carlo.     For Pharmacy Admin Tracking Only    Program: Medication Refill  Intervention Detail: New Rx: 1, reason: Patient Preference  Time Spent (min): 5    Requested Prescriptions     Pending Prescriptions Disp Refills    albuterol  sulfate HFA (PROVENTIL ;VENTOLIN ;PROAIR ) 108 (90 Base) MCG/ACT inhaler 18 g 0     Sig: Inhale 2 puffs into the lungs 4 times daily         "

## 2024-03-27 ENCOUNTER — Ambulatory Visit
Admit: 2024-03-27 | Discharge: 2024-03-27 | Payer: Medicare (Managed Care) | Attending: Student in an Organized Health Care Education/Training Program | Primary: Student in an Organized Health Care Education/Training Program

## 2024-03-27 VITALS — BP 108/74 | HR 97 | Temp 97.50000°F

## 2024-03-27 DIAGNOSIS — K219 Gastro-esophageal reflux disease without esophagitis: Principal | ICD-10-CM

## 2024-03-27 DIAGNOSIS — F900 Attention-deficit hyperactivity disorder, predominantly inattentive type: Secondary | ICD-10-CM

## 2024-03-27 MED ORDER — PANTOPRAZOLE SODIUM 40 MG PO TBEC
40 | ORAL_TABLET | Freq: Every day | ORAL | 1 refills | 90.00000 days | Status: AC
Start: 2024-03-27 — End: ?

## 2024-03-27 MED ORDER — TRAZODONE HCL 150 MG PO TABS
150 | ORAL_TABLET | Freq: Every evening | ORAL | 1 refills | 30.00000 days | Status: AC
Start: 2024-03-27 — End: ?

## 2024-03-27 MED ORDER — CYANOCOBALAMIN 1000 MCG/ML IJ KIT
1000 | PACK | INTRAMUSCULAR | 11 refills | 75.00000 days | Status: AC
Start: 2024-03-27 — End: ?

## 2024-03-27 NOTE — Progress Notes (Signed)
 "  Linda Hull (DOB:  1955-11-20) is a 68 y.o. female, Established patient, here for evaluation of the following chief complaint(s):  Follow-up        Subjective   SUBJECTIVE/OBJECTIVE:  Patient presents today for follow-up for the following:    Chronic problems:  Chronic back, hip and neck pain: Saw orthopedic surgery, but has not gone back. Has not seen PT. She does use a walker to get around. XR showed scoliosis, lordosis and degenerative disc disease.   Has had more falls recently - Has fallen 6 times, unconscious for 3  Tinnitus and dizziness - Tinnitus has worsened. Has not seen ENT - was previously referred    Allergies/congestion: On flonase  but had issue with flonase  - ended up in ER per patient  Mild persistent asthma - On advair  BID and has albuterol  PRN , she has cut down on advair     ADHD: States she is on Adderall  5 mg BID - doing well, does not take it regularly    Bipolar II: has not seen psychiatrist in 11-12 years. States this is well controlled. I did note she is on Cymbalta  60 mg, but no medications for bipolar - she has not been taking cymbalta  and is doing well    GERD with history of duodenal/gastric ulcers: Patient is on protonix  40 mg daily    Leg swelling: She has lasix  that she uses as needed - 20 mg PRN - No known history of heart failure.   Has had issues with swelling since age 89.    Vitamin B12/D deficiency - not really taking supplements. Was on B12 shots in the past - Vitamin B-12 was borderline low previously    Iron deficiency - recent levels okay    Urinary incontinence - has to wear pads - bothersome for patient - would like to see specialist      Preventative care:  Health Maintenance Due   Topic Date Due    DTaP/Tdap/Td vaccine (1 - Tdap) Never done    Pneumococcal 50+ years Vaccine (1 of 2 - PCV) Never done    Breast cancer screen  Never done    Colorectal Cancer Screen  Never done    Shingles vaccine (1 of 2) Never done    DEXA (modify frequency per FRAX score)  Never done     Respiratory Syncytial Virus (RSV) Pregnant or age 59 yrs+ (1 - Risk 60-74 years 1-dose series) Never done    Flu vaccine (1) Never done    COVID-19 Vaccine (1 - 2024-25 season) Never done      Pap smear: Been >5 years  Colonoscopy: 14-15 years ago. Discussed repeat - patient opted for Colonoscopy  Patient needs to schedule CT lung, Mammogram, and DEXA  Discussed vaccinations - refused    Past Medical History:   Diagnosis Date    Duodenal ulcer 07/01/2021    Gastric ulcer 07/01/2021     History reviewed. No pertinent surgical history.   Family History   Problem Relation Age of Onset    Lung Cancer Mother         non smoker    Hypertension Mother     Thalassemia Mother     High Blood Pressure Father     High Cholesterol Father     High Cholesterol Brother     Diabetes Brother     High Cholesterol Brother     Diabetes Brother      Social History     Socioeconomic  History    Marital status: Single     Spouse name: Not on file    Number of children: Not on file    Years of education: Not on file    Highest education level: Not on file   Occupational History    Not on file   Tobacco Use    Smoking status: Every Day     Current packs/day: 0.50     Average packs/day: 0.5 packs/day for 42.8 years (21.4 ttl pk-yrs)     Types: Cigarettes     Start date: 06/22/1981    Smokeless tobacco: Never   Substance and Sexual Activity    Alcohol use: Not Currently     Comment: [place] every day.    Drug use: Never    Sexual activity: Not Currently   Other Topics Concern    Not on file   Social History Narrative    Not on file     Social Drivers of Health     Financial Resource Strain: Low Risk  (07/21/2022)    Overall Financial Resource Strain (CARDIA)     Difficulty of Paying Living Expenses: Not hard at all   Recent Concern: Financial Resource Strain - High Risk (06/22/2022)    Overall Financial Resource Strain (CARDIA)     Difficulty of Paying Living Expenses: Very hard   Food Insecurity: No Food Insecurity (11/24/2023)    Hunger Vital Sign      Worried About Running Out of Food in the Last Year: Never true     Ran Out of Food in the Last Year: Never true   Transportation Needs: No Transportation Needs (11/24/2023)    PRAPARE - Therapist, Art (Medical): No     Lack of Transportation (Non-Medical): No   Physical Activity: Inactive (11/24/2023)    Exercise Vital Sign     Days of Exercise per Week: 0 days     Minutes of Exercise per Session: 0 min   Stress: Not on file   Social Connections: Not on file   Intimate Partner Violence: Not on file   Housing Stability: Low Risk  (11/24/2023)    Housing Stability Vital Sign     Unable to Pay for Housing in the Last Year: No     Number of Times Moved in the Last Year: 0     Homeless in the Last Year: No       Current Outpatient Medications:     pantoprazole  (PROTONIX ) 40 MG tablet, Take 1 tablet by mouth daily, Disp: 90 tablet, Rfl: 1    traZODone  (DESYREL ) 150 MG tablet, Take 1-2 tablets by mouth nightly, Disp: 180 tablet, Rfl: 1    Cyanocobalamin  1000 MCG/ML KIT, Inject 1 mL as directed every 30 days, Disp: 1 kit, Rfl: 11    albuterol  sulfate HFA (PROVENTIL ;VENTOLIN ;PROAIR ) 108 (90 Base) MCG/ACT inhaler, Inhale 2 puffs into the lungs 4 times daily, Disp: 18 g, Rfl: 5    furosemide  (LASIX ) 20 MG tablet, Take 1 tablet by mouth 2 times daily as needed (leg swelling), Disp: 60 tablet, Rfl: 5    magnesium chloride (MAG64) 64 MG TBEC extended release tablet, Take 1 tablet by mouth daily (with breakfast), Disp: , Rfl:     amphetamine -dextroamphetamine  (ADDERALL, 5MG ,) 5 MG tablet, Take 1 tablet by mouth 2 times daily for 30 days. Max Daily Amount: 10 mg (Patient not taking: Reported on 03/27/2024), Disp: 60 tablet, Rfl: 0  Allergies   Allergen Reactions  Latex Swelling    Hydromorphone Nausea And Vomiting    Codeine Nausea And Vomiting and Dizziness or Vertigo       Review of Systems   Constitutional:  Negative for chills and fever.   HENT:  Negative for congestion, ear pain and postnasal  drip.    Respiratory:  Negative for cough and shortness of breath.    Cardiovascular:  Negative for chest pain and palpitations.   Gastrointestinal:  Negative for abdominal pain, blood in stool, constipation, diarrhea, nausea and vomiting.   Musculoskeletal:  Positive for arthralgias, back pain, gait problem, neck pain and neck stiffness. Negative for myalgias.   Neurological:  Negative for dizziness, numbness and headaches.   Psychiatric/Behavioral:  Positive for decreased concentration and sleep disturbance. Negative for dysphoric mood. The patient is not nervous/anxious.           Objective   Vitals:    03/27/24 1150   BP: 108/74   BP Site: Right Upper Arm   Patient Position: Sitting   Pulse: 97   Temp: 97.5 F (36.4 C)   TempSrc: Oral   SpO2: 98%        Physical Exam  Vitals and nursing note reviewed.   Constitutional:       General: She is not in acute distress.     Appearance: Normal appearance. She is not ill-appearing.   HENT:      Head:      Comments: Non healing ulcer in base of R ear     Mouth/Throat:      Mouth: Mucous membranes are moist.      Pharynx: Oropharynx is clear.   Cardiovascular:      Rate and Rhythm: Regular rhythm.      Heart sounds: No murmur heard.  Pulmonary:      Effort: Pulmonary effort is normal.      Breath sounds: Normal breath sounds. No wheezing.   Abdominal:      General: Bowel sounds are normal. There is no distension.      Palpations: Abdomen is soft.      Tenderness: There is no abdominal tenderness.   Musculoskeletal:      Right lower leg: No edema.      Left lower leg: No edema.   Neurological:      Mental Status: She is alert.   Psychiatric:         Mood and Affect: Mood normal.         Behavior: Behavior normal.                Assessment & Plan   ASSESSMENT/PLAN:  1. Attention deficit hyperactivity disorder (ADHD), predominantly inattentive type  2. Chronic fatigue  3. Bipolar affective disorder, remission status unspecified (HCC)  4. Primary insomnia  -     traZODone   (DESYREL ) 150 MG tablet; Take 1-2 tablets by mouth nightly, Disp-180 tablet, R-1Normal  5. Gastroesophageal reflux disease without esophagitis  -     pantoprazole  (PROTONIX ) 40 MG tablet; Take 1 tablet by mouth daily, Disp-90 tablet, R-1Normal  6. History of gastric ulcer  7. Mild persistent asthma without complication  8. 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 Chronic bilateral low back pain without sciatica  11. Bilateral hip joint arthritis  12. Edema of both lower extremities  13. Poor dentition  14. Tinnitus of both ears  15. Ear drainage right  16. Urinary incontinence, unspecified type  -  AFL - Meekins, Ana, MD, Urogynecology, [place]  17. Elevated cholesterol  18. Impaired fasting glucose  19. Iron deficiency anemia, unspecified iron deficiency anemia type  20. Vitamin B12 deficiency  -     Cyanocobalamin  1000 MCG/ML KIT; Inject 1 mL as directed every 30 days, Disp-1 kit, R-11Normal  21. Vitamin D deficiency  22. Personal history of tobacco use             Patient presents today for follow-up for the above issues. See plan below:    ADHD: Chronic and stable. Patient is on Adderall 5 mg BID but does not usually take BID and doesn't take daily. PDMP reviewed and consistent. UDS has been good. Continue current RX. Repeat UDS. Follow-up in 6 months  Chronic fatigue: Ongoing - check vitamin levels and thyroid function. Hold on lyrica  due to fall risk.  Bipolar disorder: Chronic and stable per patient. Referral placed to neuropsychology previously - she has not scheduled. Follow-up PRN.  Insomnia: Patient is on trazodone  150-300 mg nightly. Risks addressed with patient today - she is aware, refill provided.  GERD with history of ulcers: Chronic and stable. Continue protonix  for 40 mg daily. Discussed trying to switch to EOD dosing given risk for depression and decreased bone density with prolonged PPI use - patient states she still has significant symptoms - referral to GI placed  previously - encouraged her to schedule  See #7 above'  Mild persistent asthma: Taking albuterol  as needed. 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 Referral placed to PT. Patient established with orthopedic surgery. Follow-up PRN.  Chronic hip pain/arthritis: Uncontrolled. PT, ortho   Edema: Chronic and stable. Continue lasix  as needed   Poor dentition: Chronic. Recommended dentistry - information for [facility] dent[facility] provided previously  Tinnitus: Chronic and worsening. With balance and falls, referral placed to ENT previously - has not scheduled  Increased ear drainage - referral to ENT  Elevated Cholesterol: Chronic and uncontrolled. Patient does not want to be on medication. Diet and lifestyle re-inforced - consider fish oil.  Impaired glucose tolerance: Chronic and stable. Not due for repeat at this time  Iron deficiency anemia: noted in the past. Chronic and stable. Recent levels normal.  Vitamin B12 deficiency: Chronic and stable. Restart monthly injection. Follow-up at next visit  Vitamin D deficiency: Chronic and stable. Continue low dose supplementaiton  Smoking: Patient still using cigarettes. Counseled briefly, but patient did not want to quit.     Patient refuses mammograms, cologuard and DEXA scan.       Return in about 6 months (around 09/24/2024) for Medicare Wellness.         An electronic signature was used to authenticate this note.    --Heide Cowing, MD  "

## 2024-03-27 NOTE — Progress Notes (Signed)
"  RM  14    Chief Complaint   Patient presents with    Follow-up       Vitals:    03/27/24 1150   BP: 108/74   BP Site: Right Upper Arm   Patient Position: Sitting   Pulse: 97   Temp: 97.5 F (36.4 C)   TempSrc: Oral   SpO2: 98%             Have you been to the ER, urgent care clinic since your last visit?  Hospitalized since your last visit?    NO    Have you seen or consulted any other health care providers outside of [facility] since your last visit?    NO    Have you had a mammogram?   NO    No breast cancer screening on file         Have you had a colorectal cancer screening such as a colonoscopy/FIT/Cologuard?    NO    No colonoscopy on file  No cologuard on file  No FIT/FOBT on file   No flexible sigmoidoscopy on file         Click Here for Release of Records Request   AVS  education, follow up, and recommendations provided and addressed with patient. Interpreter services used to advise patient No    "

## 2024-04-25 ENCOUNTER — Telehealth

## 2024-04-25 NOTE — Telephone Encounter (Signed)
"  Pt left a voice message requesting a return call regarding the directions for the Trazodone  150 mg that was sent to the [facility].    Pt had stated that she takes the Trazodone  150 mg 2 to 2 & 1/2 tabs Nightly.     ARW:[phone]    Last appointment: 03/27/2024 MD Lucillie   Next appointment: Nothing scheduled  Previous refill encounter(s):   03/27/2024 Desyrel  #180 with 1 refill.     For Pharmacy Admin Tracking Only    Program: Medication Refill  Intervention Detail: New Rx: 1, reason: Patient Preference  Time Spent (min): 5    Requested Prescriptions     Pending Prescriptions Disp Refills    traZODone  (DESYREL ) 150 MG tablet 180 tablet 1     Sig: Take 1-2 tablets by mouth nightly       "

## 2024-06-25 ENCOUNTER — Ambulatory Visit
Payer: Medicare (Managed Care) | Attending: Student in an Organized Health Care Education/Training Program | Primary: Student in an Organized Health Care Education/Training Program
